# Patient Record
Sex: Female | Born: 1985 | Race: White | Hispanic: No | Marital: Married | Smoking: Current every day smoker
Health system: Southern US, Community
[De-identification: ages and names within clinical notes are randomized; demographics above are authoritative.]

## PROBLEM LIST (undated history)

## (undated) ENCOUNTER — Inpatient Hospital Stay: Payer: Self-pay

## (undated) DIAGNOSIS — F419 Anxiety disorder, unspecified: Secondary | ICD-10-CM

## (undated) DIAGNOSIS — S069X9A Unspecified intracranial injury with loss of consciousness of unspecified duration, initial encounter: Secondary | ICD-10-CM

## (undated) DIAGNOSIS — D369 Benign neoplasm, unspecified site: Secondary | ICD-10-CM

## (undated) HISTORY — DX: Unspecified intracranial injury with loss of consciousness of unspecified duration, initial encounter: S06.9X9A

## (undated) HISTORY — PX: OTHER SURGICAL HISTORY: SHX169

---
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
## 2007-02-18 DIAGNOSIS — S069XAA Unspecified intracranial injury with loss of consciousness status unknown, initial encounter: Secondary | ICD-10-CM

## 2007-02-18 DIAGNOSIS — S069X9A Unspecified intracranial injury with loss of consciousness of unspecified duration, initial encounter: Secondary | ICD-10-CM

## 2007-02-18 HISTORY — DX: Unspecified intracranial injury with loss of consciousness status unknown, initial encounter: S06.9XAA

## 2007-02-18 HISTORY — DX: Unspecified intracranial injury with loss of consciousness of unspecified duration, initial encounter: S06.9X9A

## 2007-09-20 ENCOUNTER — Emergency Department: Payer: Self-pay | Admitting: Emergency Medicine

## 2007-11-28 ENCOUNTER — Emergency Department: Payer: Self-pay | Admitting: Emergency Medicine

## 2011-10-14 ENCOUNTER — Emergency Department: Payer: Self-pay | Admitting: Emergency Medicine

## 2011-10-14 LAB — CBC
HCT: 32.8 % — ABNORMAL LOW (ref 35.0–47.0)
HGB: 10.9 g/dL — ABNORMAL LOW (ref 12.0–16.0)
MCH: 30.8 pg (ref 26.0–34.0)
MCHC: 33.2 g/dL (ref 32.0–36.0)
Platelet: 294 10*3/uL (ref 150–440)
RDW: 12.8 % (ref 11.5–14.5)
WBC: 11.4 10*3/uL — ABNORMAL HIGH (ref 3.6–11.0)

## 2011-10-14 LAB — URINALYSIS, COMPLETE
Ketone: NEGATIVE
Nitrite: NEGATIVE
Ph: 7 (ref 4.5–8.0)
Protein: NEGATIVE
RBC,UR: <1 /HPF (ref 0–5)
Specific Gravity: 1.026 (ref 1.003–1.030)

## 2011-10-14 LAB — COMPREHENSIVE METABOLIC PANEL
Albumin: 3.2 g/dL — ABNORMAL LOW (ref 3.4–5.0)
Alkaline Phosphatase: 60 U/L (ref 50–136)
Bilirubin,Total: 0.2 mg/dL (ref 0.2–1.0)
Co2: 25 mmol/L (ref 21–32)
Creatinine: 0.55 mg/dL — ABNORMAL LOW (ref 0.60–1.30)
EGFR (Non-African Amer.): >60
SGPT (ALT): 46 U/L (ref 12–78)

## 2012-01-11 ENCOUNTER — Ambulatory Visit: Payer: Self-pay | Admitting: Medical

## 2012-01-11 LAB — RAPID STREP-A WITH REFLX: Micro Text Report: NEGATIVE

## 2012-01-14 LAB — BETA STREP CULTURE(ARMC)

## 2012-02-19 ENCOUNTER — Inpatient Hospital Stay: Payer: Self-pay | Admitting: Obstetrics and Gynecology

## 2012-02-19 LAB — PIH PROFILE
Anion Gap: 10 (ref 7–16)
Calcium, Total: 8.7 mg/dL (ref 8.5–10.1)
Chloride: 107 mmol/L (ref 98–107)
EGFR (African American): >60
EGFR (Non-African Amer.): >60
Glucose: 85 mg/dL (ref 65–99)
HCT: 34.7 % — ABNORMAL LOW (ref 35.0–47.0)
MCH: 32.1 pg (ref 26.0–34.0)
MCHC: 34 g/dL (ref 32.0–36.0)
MCV: 94 fL (ref 80–100)
Platelet: 204 10*3/uL (ref 150–440)
Potassium: 4 mmol/L (ref 3.5–5.1)
SGOT(AST): 33 U/L (ref 15–37)
Sodium: 137 mmol/L (ref 136–145)
Uric Acid: 5.6 mg/dL (ref 2.6–6.0)
WBC: 9.1 10*3/uL (ref 3.6–11.0)

## 2012-02-19 LAB — PROTEIN / CREATININE RATIO, URINE: Creatinine, Urine: 49.6 mg/dL (ref 30.0–125.0)

## 2012-02-20 LAB — PIH PROFILE
Anion Gap: 9 (ref 7–16)
Calcium, Total: 8.6 mg/dL (ref 8.5–10.1)
Co2: 23 mmol/L (ref 21–32)
Creatinine: 0.87 mg/dL (ref 0.60–1.30)
EGFR (African American): >60
EGFR (Non-African Amer.): >60
HGB: 11.4 g/dL — ABNORMAL LOW (ref 12.0–16.0)
Osmolality: 279 (ref 275–301)
Platelet: 208 10*3/uL (ref 150–440)
SGOT(AST): 33 U/L (ref 15–37)
Sodium: 139 mmol/L (ref 136–145)
Uric Acid: 6.8 mg/dL — ABNORMAL HIGH (ref 2.6–6.0)

## 2012-02-21 LAB — HEMATOCRIT: HCT: 34.4 % — ABNORMAL LOW (ref 35.0–47.0)

## 2012-03-14 ENCOUNTER — Ambulatory Visit: Payer: Self-pay | Admitting: Internal Medicine

## 2012-11-14 ENCOUNTER — Ambulatory Visit: Payer: Self-pay | Admitting: Family Medicine

## 2012-11-14 LAB — RAPID STREP-A WITH REFLX: Micro Text Report: NEGATIVE

## 2014-06-27 NOTE — H&P (Signed)
L&D Evaluation:  History:   HPI 29yo G2P0010 at [redacted]w[redacted]d by 1st trimester U/S presents from the office for evaluation of elevated BPs, 140s/90s.  She denies HA, VC, RUQ pain.  She has mild UE and LE edema.  Good FM.  No LOF, VB or ctx.  PNC at [facility], uncomplicated.  PNL - A+, RI, VI, GBS neg.    Patient's Medical History anxiety    Patient's Surgical History leg surgery    Medications Pre Natal Vitamins    Allergies codeine    Social History none    Family History Non-Contributory   ROS:   ROS All systems were reviewed.  HEENT, CNS, GI, GU, Respiratory, CV, Renal and Musculoskeletal systems were found to be normal.   Exam:   Vital Signs 120-140/60-80    General no apparent distress    Mental Status clear    Chest normal effort    Abdomen gravid, non-tender    Estimated Fetal Weight Average for gestational age    Edema 1+    Pelvic closed in office today    Mebranes Intact    FHT normal rate with no decels, reactive NST    Ucx irregular    Skin dry    Lymph no lymphadenopathy    Other preE labs WNL, but PC ratio very elevated at 2500   Impression:   Impression G2P0010 at [redacted]w[redacted]d with likely mild preE   Plan:   Plan EFM/NST, monitor contractions and for cervical change, monitor BP    Comments - long discussion with patient re: likely diagnosis of preE given dramatically elevated PC ratio and elevated BPs, including risk to baby and self with continuation of pregnancy.  Discussed options of 24hr urine collection to pinpoint diagnosis vs. proceed with IOL.  Pt understands risks and benefits of each option and that IOL with unfavorable cervix increases her risk for C/S.  After careful contemplation has decided to proceed with IOL.  Will start with cervidil, followed by pitocin in am. - if BPs remain elevated will need to start magnesium in active labor.   Electronic Signatures: Thurmond Butts, Elinor Parkinson (MD)  (Signed 02-Jan-14 19:50)  Authored: L&D  Evaluation   Last Updated: 02-Jan-14 19:50 by Thurmond Butts, Elinor Parkinson (MD)

## 2015-11-01 LAB — HM PAP SMEAR

## 2016-01-07 ENCOUNTER — Other Ambulatory Visit: Payer: Self-pay | Admitting: Advanced Practice Midwife

## 2016-01-07 DIAGNOSIS — Z3482 Encounter for supervision of other normal pregnancy, second trimester: Secondary | ICD-10-CM

## 2016-01-13 ENCOUNTER — Emergency Department
Admission: EM | Admit: 2016-01-13 | Discharge: 2016-01-13 | Disposition: A | Discharge status: Home / Self Care | Payer: Self-pay | Attending: Emergency Medicine | Admitting: Emergency Medicine

## 2016-01-13 ENCOUNTER — Encounter: Payer: Self-pay | Admitting: Emergency Medicine

## 2016-01-13 DIAGNOSIS — O23592 Infection of other part of genital tract in pregnancy, second trimester: Secondary | ICD-10-CM | POA: Insufficient documentation

## 2016-01-13 DIAGNOSIS — O26892 Other specified pregnancy related conditions, second trimester: Secondary | ICD-10-CM

## 2016-01-13 DIAGNOSIS — R109 Unspecified abdominal pain: Secondary | ICD-10-CM

## 2016-01-13 DIAGNOSIS — N760 Acute vaginitis: Secondary | ICD-10-CM

## 2016-01-13 DIAGNOSIS — Z3A20 20 weeks gestation of pregnancy: Secondary | ICD-10-CM | POA: Insufficient documentation

## 2016-01-13 DIAGNOSIS — B9689 Other specified bacterial agents as the cause of diseases classified elsewhere: Secondary | ICD-10-CM

## 2016-01-13 HISTORY — DX: Benign neoplasm, unspecified site: D36.9

## 2016-01-13 LAB — COMPREHENSIVE METABOLIC PANEL
ALT: 15 U/L (ref 14–54)
ANION GAP: 8 (ref 5–15)
AST: 18 U/L (ref 15–41)
Albumin: 3.4 g/dL — ABNORMAL LOW (ref 3.5–5.0)
Alkaline Phosphatase: 39 U/L (ref 38–126)
BILIRUBIN TOTAL: 0.2 mg/dL — AB (ref 0.3–1.2)
BUN: 10 mg/dL (ref 6–20)
CO2: 22 mmol/L (ref 22–32)
Calcium: 8.4 mg/dL — ABNORMAL LOW (ref 8.9–10.3)
Chloride: 106 mmol/L (ref 101–111)
Creatinine, Ser: 0.58 mg/dL (ref 0.44–1.00)
Glucose, Bld: 106 mg/dL — ABNORMAL HIGH (ref 65–99)
POTASSIUM: 3.9 mmol/L (ref 3.5–5.1)
Sodium: 136 mmol/L (ref 135–145)
TOTAL PROTEIN: 7.2 g/dL (ref 6.5–8.1)

## 2016-01-13 LAB — WET PREP, GENITAL
SPERM: NONE SEEN
Trich, Wet Prep: NONE SEEN
YEAST WET PREP: NONE SEEN

## 2016-01-13 LAB — URINALYSIS COMPLETE WITH MICROSCOPIC (ARMC ONLY)
Bilirubin Urine: NEGATIVE
GLUCOSE, UA: NEGATIVE mg/dL
HGB URINE DIPSTICK: NEGATIVE
KETONES UR: NEGATIVE mg/dL
NITRITE: NEGATIVE
Protein, ur: NEGATIVE mg/dL
SPECIFIC GRAVITY, URINE: 1.017 (ref 1.005–1.030)
pH: 6 (ref 5.0–8.0)

## 2016-01-13 LAB — LIPASE, BLOOD: Lipase: 24 U/L (ref 11–51)

## 2016-01-13 LAB — CBC
HEMATOCRIT: 33.2 % — AB (ref 35.0–47.0)
HEMOGLOBIN: 11.5 g/dL — AB (ref 12.0–16.0)
MCH: 31.3 pg (ref 26.0–34.0)
MCHC: 34.7 g/dL (ref 32.0–36.0)
MCV: 90.1 fL (ref 80.0–100.0)
Platelets: 289 10*3/uL (ref 150–440)
RBC: 3.69 MIL/uL — ABNORMAL LOW (ref 3.80–5.20)
RDW: 13 % (ref 11.5–14.5)
WBC: 10.6 10*3/uL (ref 3.6–11.0)

## 2016-01-13 LAB — CHLAMYDIA/NGC RT PCR (ARMC ONLY)
CHLAMYDIA TR: NOT DETECTED
N GONORRHOEAE: NOT DETECTED

## 2016-01-13 MED ORDER — METRONIDAZOLE 500 MG PO TABS: 500.0000 mg | ORAL_TABLET | Freq: Two times a day (BID) | ORAL | 0 refills | Status: AC

## 2016-01-13 NOTE — ED Triage Notes (Signed)
abd pain - rlq. [redacted] weeks pregnant. No bleeding or spotting

## 2016-01-13 NOTE — ED Provider Notes (Signed)
[facility] Emergency Department Provider Note  Time seen: 2:22 PM  I have reviewed the triage vital signs and the nursing notes.   HISTORY  Chief Complaint Abdominal Pain    HPI Vickie Gomez is a 30 y.o. female G3 P1 A1, who presents the emergency department approximately [redacted] weeks pregnant with right-sided abdominal pain. According to the patient yesterday she was moving when she felt a sharp pain in the right side of her abdomen. States it felt like a pulled muscle. Patient states she did not think much of it however today the pain continues so she came to the emergency department for evaluation. She states no pain if she is sitting up, but experiences mild to moderate sharp pain in the right lower quadrant when lying flat, or twisting. Denies nausea, vomiting, diarrhea. Denies dysuria. Denies vaginal bleeding but does state increased vaginal discharge.  Past Medical History:  Diagnosis Date  . Dermoid cyst     There are no active problems to display for this patient.   No past surgical history on file.  Prior to Admission medications   Not on File    No Known Allergies  No family history on file.  Social History Social History  Substance Use Topics  . Smoking status: Never Smoker  . Smokeless tobacco: Never Used  . Alcohol use No    Review of Systems Constitutional: Negative for fever. Cardiovascular: Negative for chest pain. Respiratory: Negative for shortness of breath. Gastrointestinal: Right lower quadrant pain. Negative for nausea, vomiting, diarrhea. Genitourinary: Negative for dysuria. Positive for vaginal discharge. Musculoskeletal: Negative for back pain. Neurological: Negative for headache 10-point ROS otherwise negative.  ____________________________________________   PHYSICAL EXAM:  VITAL SIGNS: ED Triage Vitals  Enc Vitals Group     BP 01/13/16 1310 (!) 121/58     Pulse Rate 01/13/16 1310 85     Resp 01/13/16  1310 16     Temp 01/13/16 1310 98.2 F (36.8 C)     Temp src --      SpO2 01/13/16 1310 98 %     Weight 01/13/16 1311 134 lb (60.8 kg)     Height 01/13/16 1311 5\' 4"  (1.626 m)     Head Circumference --      Peak Flow --      Pain Score 01/13/16 1311 7     Pain Loc --      Pain Edu? --      Excl. in [place]? --     Constitutional: Alert and oriented. Well appearing and in no distress. Eyes: Normal exam ENT   Head: Normocephalic and atraumatic.   Mouth/Throat: Mucous membranes are moist. Cardiovascular: Normal rate, regular rhythm. No murmur Respiratory: Normal respiratory effort without tachypnea nor retractions. Breath sounds are clear Gastrointestinal: Soft, mild right lower quadrant tenderness palpation, no rebound or guarding, no distention. Mild suprapubic tenderness. Musculoskeletal: Nontender with normal range of motion in all extremities.  Neurologic:  Normal speech and language. No gross focal neurologic deficits  Skin:  Skin is warm, dry and intact.  Psychiatric: Mood and affect are normal.   ____________________________________________   INITIAL IMPRESSION / ASSESSMENT AND PLAN / ED COURSE  Pertinent labs & imaging results that were available during my care of the patient were reviewed by me and considered in my medical decision making (see chart for details).  The patient presents the emergency department with right lower quadrant pain approximately [redacted] weeks pregnant. Patient has mild tenderness on exam of right  lower quadrant and suprapubic region. No rebound or guarding. Denies any pain if she is sitting upright. Patient's labs are largely within normal limits including a normal white blood cell count. Very low suspicion for appendicitis at this time. Her symptoms appear to be more muscular in nature. Patient does describe increased vaginal discharge will perform a pelvic examination and send swabs. I'll perform a bedside ultrasound to further evaluate the  pregnancy.  Bedside ultrasound shows a normal intrauterine pregnancy. 158 bpm, good fetal movement, normal amount of amniotic fluid. Right-sided placenta.  Pelvic examination shows mild cervical erythema, mild to moderate discharge. No significant adnexal tenderness. No cervical motion tenderness. Wet prep, STD testing sent.  Given the patient's abdominal discomfort I discussed very strict return precautions for worsening pain or development of fever. However given her normal white blood cell count and minimal tenderness on examination along with the positional pain I highly suspect muscular pain, but discussed the possibility of appendicitis or ovarian cysts. Patient will follow up with her OB/GYN. She will return to the emergency department in 24 hours if the pain worsens or she spikes a fever for further evaluation and possible MRI.  Wet prep is positive for BV. We'll given Flagyl. Patient will follow up with her OB/GYN. I discussed strict return precautions. ____________________________________________   FINAL CLINICAL IMPRESSION(S) / ED DIAGNOSES  Abdominal pain during pregnancy Bacterial vaginitis   Harvest Dark, MD 01/13/16 1513

## 2016-01-13 NOTE — Discharge Instructions (Signed)
As we discussed please take antibiotic as prescribed for his entire duration. Please follow-up with your OB/GYN as soon as possible. Return to the emergency department if your abdominal pain worsens or you develop a fever, or any other symptom personally concerning to yourself.

## 2016-01-13 NOTE — ED Triage Notes (Signed)
Patient reports abd pain to RUQ, RLQ, and LLQ. States RLQ pain is severe and sharp in nature. States she sees [facility]. Due date is 06/07/15 (just shy of 20 weeks). Denies any bleeding.

## 2016-01-15 ENCOUNTER — Ambulatory Visit
Admission: RE | Admit: 2016-01-15 | Discharge: 2016-01-15 | Disposition: A | Discharge status: Home / Self Care | Payer: Self-pay | Source: Ambulatory Visit | Attending: Advanced Practice Midwife | Admitting: Advanced Practice Midwife

## 2016-01-15 DIAGNOSIS — Z3A19 19 weeks gestation of pregnancy: Secondary | ICD-10-CM | POA: Insufficient documentation

## 2016-01-15 DIAGNOSIS — Z3482 Encounter for supervision of other normal pregnancy, second trimester: Secondary | ICD-10-CM

## 2016-02-18 NOTE — L&D Delivery Note (Signed)
Delivery Note Primary OB: [place] Delivery Physician: Barnett Applebaum, MD Gestational Age: Full term Antepartum complications: none Intrapartum complications: None  A viable Female was delivered via vertex perentation.  Apgars:8 ,9  Weight:  pending .   Placenta status: spontaneous and Intact.  Cord: 3+ vessels;  with the following complications: none.  Anesthesia:  epidural Episiotomy:  none Lacerations:  none Suture Repair: none Est. Blood Loss (mL):  less than 100 mL  Mom to postpartum.  Baby to Couplet care / Skin to Skin.  Barnett Applebaum, MD, [facility], [facility] 06/12/2016  5:33 PM [phone]

## 2016-04-01 ENCOUNTER — Observation Stay
Admission: EM | Admit: 2016-04-01 | Discharge: 2016-04-02 | Disposition: A | Discharge status: Home / Self Care | Payer: Self-pay | Attending: Certified Nurse Midwife | Admitting: Certified Nurse Midwife

## 2016-04-01 DIAGNOSIS — O368130 Decreased fetal movements, third trimester, not applicable or unspecified: Principal | ICD-10-CM | POA: Insufficient documentation

## 2016-04-01 DIAGNOSIS — O99343 Other mental disorders complicating pregnancy, third trimester: Secondary | ICD-10-CM | POA: Insufficient documentation

## 2016-04-01 DIAGNOSIS — O368190 Decreased fetal movements, unspecified trimester, not applicable or unspecified: Secondary | ICD-10-CM

## 2016-04-01 DIAGNOSIS — F419 Anxiety disorder, unspecified: Secondary | ICD-10-CM | POA: Insufficient documentation

## 2016-04-01 DIAGNOSIS — Z3A30 30 weeks gestation of pregnancy: Secondary | ICD-10-CM | POA: Insufficient documentation

## 2016-04-01 HISTORY — DX: Anxiety disorder, unspecified: F41.9

## 2016-04-01 NOTE — Final Progress Note (Signed)
Physician Final Progress Note  Patient ID: Vickie Gomez MRN: [id number] DOB/AGE: 06-06-1985 31 y.o.  Admit date: 04/01/2016 Admitting provider: Malachy Mood, MD Discharge date: 04/01/2016   Admission Diagnoses: Decreased fetal movement at 30wk4d  Discharge Diagnoses:  Reactive Non stress test Consult: none  Significant Findings/ Diagnostic Studies: 31 year old G3 P1011 with EDC=06/07/2016 by a 7wk ultrasound presented at 30wk 4d with complaints of decreased fetal movement today. Has been eating and drinking well. Denies vaginal bleeding and contractions. Takes occasional clonazapam for panic attacks. Last dose 2 days ago. Prenatal care at [facility] and [facility].  Non stress test performed. NST reactive with baseline 140 and accelerations to 150s to 180s with moderate variability. Baby very active. Patient began feeling movement after monitors applied. Discharged home, reassured with fetal monitoring. Procedures: Non stress test  Discharge Condition: good  Disposition: 01-Home or Self Care  Diet: Regular diet  Discharge Activity: Activity as tolerated  Discharge Instructions    Discharge patient    Complete by:  As directed    Discharge disposition:  01-Home or Self Care   Discharge patient date:  04/01/2016     Allergies as of 04/01/2016   No Known Allergies     Medication List    TAKE these medications   clonazePAM 0.5 MG tablet Commonly known as:  KLONOPIN Take 0.5 mg by mouth 2 (two) times daily as needed for anxiety.   prenatal multivitamin Tabs tablet Take 1 tablet by mouth daily at 12 noon.      Follow-up Information    [facility] Follow up on 04/04/2016.   Why:  please keep scheduled OB appointment Contact information: [address] [place] [id number] [phone]        Follow up appt at [facility] 04/07/2016 0850  Total time spent taking care of this patient: 10-15 minutes.  SignedDalia Heading 04/01/2016, 10:18 PM

## 2016-04-01 NOTE — Discharge Summary (Signed)
Patient discharged with instructions on follow up appointment, labor precautions, fetal kick count, and when to seek medical attention. Patient ambulatory at discharge with steady gait and no complaints. Patient verbalized understanding of discharge instructions.

## 2016-04-01 NOTE — OB Triage Note (Signed)
Patient came in for observation for decreased fetal movement. Patient states she has only felt little flutters today and not the normal fetal movement. Patient states she has been having frequent nightmares atr night where she wake up in a panic attack. Patient denies uterine contractions. Patient denies leaking of fluid, vaginal bleeding and spotting. Vital signs stable and patient afebrile. FHR baseline 1305 with moderate variability with accelerations 15 x 15 and no decelerations. Will continue to monitor.

## 2016-04-25 DIAGNOSIS — O09299 Supervision of pregnancy with other poor reproductive or obstetric history, unspecified trimester: Secondary | ICD-10-CM

## 2016-04-25 LAB — HM PAP SMEAR
Chlamydia DNA by PCR: NEGATIVE
HPV Aptima: NEGATIVE
Neisseria gonorrhoeae, NAA: NEGATIVE

## 2016-04-28 ENCOUNTER — Ambulatory Visit (INDEPENDENT_AMBULATORY_CARE_PROVIDER_SITE_OTHER): Payer: Self-pay | Admitting: Obstetrics & Gynecology

## 2016-04-28 VITALS — BP 100/60 | HR 96 | Wt 170.0 lb

## 2016-04-28 DIAGNOSIS — Z3490 Encounter for supervision of normal pregnancy, unspecified, unspecified trimester: Secondary | ICD-10-CM

## 2016-04-28 DIAGNOSIS — Z8759 Personal history of other complications of pregnancy, childbirth and the puerperium: Secondary | ICD-10-CM | POA: Insufficient documentation

## 2016-04-28 DIAGNOSIS — Z3A34 34 weeks gestation of pregnancy: Secondary | ICD-10-CM

## 2016-04-28 NOTE — Progress Notes (Signed)
PNV, [place], PTL precauitons, Preeclampsia precauitons,trying to get labs from [facility] TDaP UTD PAP ASCUS, PP PAP

## 2016-05-03 ENCOUNTER — Observation Stay
Admission: EM | Admit: 2016-05-03 | Discharge: 2016-05-03 | Disposition: A | Discharge status: Home / Self Care | Payer: Self-pay | Attending: Obstetrics and Gynecology | Admitting: Obstetrics and Gynecology

## 2016-05-03 DIAGNOSIS — Z87820 Personal history of traumatic brain injury: Secondary | ICD-10-CM | POA: Insufficient documentation

## 2016-05-03 DIAGNOSIS — Z9889 Other specified postprocedural states: Secondary | ICD-10-CM | POA: Insufficient documentation

## 2016-05-03 DIAGNOSIS — Z8659 Personal history of other mental and behavioral disorders: Secondary | ICD-10-CM | POA: Insufficient documentation

## 2016-05-03 DIAGNOSIS — Z3A35 35 weeks gestation of pregnancy: Secondary | ICD-10-CM

## 2016-05-03 DIAGNOSIS — Z888 Allergy status to other drugs, medicaments and biological substances status: Secondary | ICD-10-CM | POA: Insufficient documentation

## 2016-05-03 DIAGNOSIS — Z885 Allergy status to narcotic agent status: Secondary | ICD-10-CM | POA: Insufficient documentation

## 2016-05-03 DIAGNOSIS — O368131 Decreased fetal movements, third trimester, fetus 1: Secondary | ICD-10-CM | POA: Diagnosis present

## 2016-05-03 DIAGNOSIS — O368130 Decreased fetal movements, third trimester, not applicable or unspecified: Principal | ICD-10-CM | POA: Insufficient documentation

## 2016-05-03 DIAGNOSIS — Z3490 Encounter for supervision of normal pregnancy, unspecified, unspecified trimester: Secondary | ICD-10-CM

## 2016-05-03 DIAGNOSIS — O09299 Supervision of pregnancy with other poor reproductive or obstetric history, unspecified trimester: Secondary | ICD-10-CM
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
## 2016-05-03 NOTE — Discharge Summary (Signed)
See final progress note. 

## 2016-05-03 NOTE — OB Triage Note (Addendum)
Pt reports decreased FM x 2 days and LOF x2 days.  Pt reports HA around eyes that has been unrelieved since last Sunday and floaters in her vision.  Pt denies epigastric pain, but reports swelling "off and on" throughout the week in her hands and lower extremities.  No swelling present currently.

## 2016-05-03 NOTE — Progress Notes (Signed)
Nitrazine negative 

## 2016-05-13 ENCOUNTER — Encounter: Payer: Self-pay | Admitting: *Deleted

## 2016-05-13 ENCOUNTER — Emergency Department
Admission: EM | Admit: 2016-05-13 | Discharge: 2016-05-13 | Disposition: A | Discharge status: Home / Self Care | Payer: Self-pay | Attending: Emergency Medicine | Admitting: Emergency Medicine

## 2016-05-13 DIAGNOSIS — O99613 Diseases of the digestive system complicating pregnancy, third trimester: Secondary | ICD-10-CM | POA: Insufficient documentation

## 2016-05-13 DIAGNOSIS — R112 Nausea with vomiting, unspecified: Secondary | ICD-10-CM

## 2016-05-13 DIAGNOSIS — Z3A36 36 weeks gestation of pregnancy: Secondary | ICD-10-CM | POA: Insufficient documentation

## 2016-05-13 DIAGNOSIS — K644 Residual hemorrhoidal skin tags: Secondary | ICD-10-CM

## 2016-05-13 DIAGNOSIS — O2243 Hemorrhoids in pregnancy, third trimester: Secondary | ICD-10-CM | POA: Insufficient documentation

## 2016-05-13 DIAGNOSIS — Z3493 Encounter for supervision of normal pregnancy, unspecified, third trimester: Secondary | ICD-10-CM

## 2016-05-13 DIAGNOSIS — K2900 Acute gastritis without bleeding: Secondary | ICD-10-CM | POA: Insufficient documentation

## 2016-05-13 LAB — CBC
HCT: 32.4 % — ABNORMAL LOW (ref 35.0–47.0)
Hemoglobin: 11 g/dL — ABNORMAL LOW (ref 12.0–16.0)
MCH: 30 pg (ref 26.0–34.0)
MCHC: 34 g/dL (ref 32.0–36.0)
MCV: 88.2 fL (ref 80.0–100.0)
PLATELETS: 303 10*3/uL (ref 150–440)
RBC: 3.67 MIL/uL — AB (ref 3.80–5.20)
RDW: 12.8 % (ref 11.5–14.5)
WBC: 12.9 10*3/uL — AB (ref 3.6–11.0)

## 2016-05-13 LAB — BASIC METABOLIC PANEL
Anion gap: 8 (ref 5–15)
BUN: 10 mg/dL (ref 6–20)
CALCIUM: 9 mg/dL (ref 8.9–10.3)
CHLORIDE: 106 mmol/L (ref 101–111)
CO2: 21 mmol/L — ABNORMAL LOW (ref 22–32)
CREATININE: 0.55 mg/dL (ref 0.44–1.00)
Glucose, Bld: 94 mg/dL (ref 65–99)
Potassium: 3.7 mmol/L (ref 3.5–5.1)
SODIUM: 135 mmol/L (ref 135–145)

## 2016-05-13 LAB — TROPONIN I

## 2016-05-13 MED ORDER — TUCKS 50 % EX PADS: 1.0000 "application " | MEDICATED_PAD | CUTANEOUS | 2 refills | Status: DC | PRN

## 2016-05-13 MED ORDER — FAMOTIDINE 20 MG PO TABS: 20.0000 mg | ORAL_TABLET | Freq: Two times a day (BID) | ORAL | 0 refills | Status: DC

## 2016-05-13 NOTE — ED Notes (Signed)
Signature pad not working.  Patient verbalized understanding of discharge instructions and has no further questions. 
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
## 2016-05-13 NOTE — ED Notes (Signed)
Spoke with nurse on labor and delivery, pt needs to be cleared medically in the ER prior to coming to labor and delivery if needed. Pt is 36 weeks with her third child, pt denies any pregnancy related issues at this time. NAD. Reports chest pain and vomiting blood.

## 2016-05-13 NOTE — ED Notes (Addendum)
Pt report reflux is worse when lying flat. Pt also reports she has felt the baby move today,

## 2016-05-13 NOTE — ED Triage Notes (Signed)
States she woke up this AM and felt chest pressure, states she vomited and noticed and bright red and dark red blood in her vomit, states some SOB when she lies down, states coughing as well, pt is [redacted] weeks pregnant, denies any vaginal bleeding or leaking fluid, awake and alert

## 2016-05-15 ENCOUNTER — Other Ambulatory Visit: Payer: Self-pay | Admitting: Obstetrics & Gynecology

## 2016-05-15 ENCOUNTER — Ambulatory Visit (INDEPENDENT_AMBULATORY_CARE_PROVIDER_SITE_OTHER): Payer: Self-pay | Admitting: Advanced Practice Midwife

## 2016-05-15 VITALS — BP 124/62 | Wt 174.0 lb

## 2016-05-15 DIAGNOSIS — Z3685 Encounter for antenatal screening for Streptococcus B: Secondary | ICD-10-CM

## 2016-05-15 DIAGNOSIS — Z113 Encounter for screening for infections with a predominantly sexual mode of transmission: Secondary | ICD-10-CM

## 2016-05-15 DIAGNOSIS — Z3A36 36 weeks gestation of pregnancy: Secondary | ICD-10-CM

## 2016-05-15 NOTE — Progress Notes (Signed)
c/o severe heartburn and was prescribed pepcid. Doing well today. Reviewed labor precautions.

## 2016-05-15 NOTE — Progress Notes (Signed)
GBS/Aptima today 

## 2016-05-18 LAB — GC/CHLAMYDIA PROBE AMP
Chlamydia trachomatis, NAA: NEGATIVE
Neisseria gonorrhoeae by PCR: NEGATIVE

## 2016-05-18 LAB — STREP GP B NAA: Strep Gp B NAA: POSITIVE — AB

## 2016-05-20 ENCOUNTER — Encounter: Payer: MEDICAID | Admitting: Obstetrics and Gynecology

## 2016-05-20 ENCOUNTER — Ambulatory Visit (INDEPENDENT_AMBULATORY_CARE_PROVIDER_SITE_OTHER): Payer: Self-pay | Admitting: Obstetrics and Gynecology

## 2016-05-20 VITALS — BP 122/60 | Wt 177.0 lb

## 2016-05-20 DIAGNOSIS — O09299 Supervision of pregnancy with other poor reproductive or obstetric history, unspecified trimester: Secondary | ICD-10-CM

## 2016-05-20 DIAGNOSIS — Z3A37 37 weeks gestation of pregnancy: Secondary | ICD-10-CM

## 2016-05-20 DIAGNOSIS — Z3490 Encounter for supervision of normal pregnancy, unspecified, unspecified trimester: Secondary | ICD-10-CM

## 2016-05-20 NOTE — Patient Instructions (Signed)

## 2016-05-23 ENCOUNTER — Encounter: Payer: Self-pay | Admitting: Advanced Practice Midwife

## 2016-05-27 ENCOUNTER — Telehealth: Payer: Self-pay

## 2016-05-27 ENCOUNTER — Encounter: Payer: Self-pay | Admitting: Obstetrics & Gynecology

## 2016-05-27 ENCOUNTER — Observation Stay
Admission: EM | Admit: 2016-05-27 | Discharge: 2016-05-27 | Disposition: A | Discharge status: Home / Self Care | Payer: Self-pay | Attending: Certified Nurse Midwife | Admitting: Certified Nurse Midwife

## 2016-05-27 ENCOUNTER — Encounter: Payer: Self-pay | Admitting: Obstetrics and Gynecology

## 2016-05-27 DIAGNOSIS — Z3A38 38 weeks gestation of pregnancy: Secondary | ICD-10-CM | POA: Insufficient documentation

## 2016-05-27 DIAGNOSIS — O368130 Decreased fetal movements, third trimester, not applicable or unspecified: Secondary | ICD-10-CM

## 2016-05-27 DIAGNOSIS — O368190 Decreased fetal movements, unspecified trimester, not applicable or unspecified: Secondary | ICD-10-CM | POA: Diagnosis present

## 2016-05-27 DIAGNOSIS — O09299 Supervision of pregnancy with other poor reproductive or obstetric history, unspecified trimester: Secondary | ICD-10-CM

## 2016-05-27 NOTE — Final Progress Note (Signed)
Physician Final Progress Note  Patient ID: Vickie Gomez MRN: [id number] DOB/AGE: [birth year] 31 y.o.  Admit date: 05/27/2016 Admitting provider: Malachy Mood, MD Discharge date: 05/27/2016   Admission Diagnoses: Decreased fetal movement and cramping  Discharge Diagnoses: IUP at 38wk3d with reactive NST Not in labor Consults: None  Significant Findings/ Diagnostic Studies: 31 year old G3 P1011 with EDC=06/07/2016 by a 7wk6d ultrasound presents with c/o decreased FM. Baby just not moving as much since yesterday unless patient pushes on her belly. Has been having some cramping and some contractions. Contractions not regular. No bleeding or leaking of fluid. Prenatal care begun at [facility], then transferred to [facility], then transferred back to [facility] in third trimester.  Prenatal care fairly uncomplicated. Hx of preeclampsia with first pregnancy at term.  Exam: BP 129/77   Pulse 75   Temp 98.2 F (36.8 C) (Oral)   Resp 16   Ht 5\' 3"  (1.6 m)   Wt 80.3 kg (177 lb)   LMP 08/22/2015   BMI 31.35 kg/m   General: WF, gravid, no acute distress FHR: 125-130 with accelerations to 150s to 160s, with moderate variability Toco: occasional mild contraction Abd: soft, uterus non tender, cephalic presentation Cervix: closed/60%/-2  A: Reactive NST at 38wk3d P: Discharge home  Procedures: NST  Discharge Condition: stable  Disposition: 01-Home or Self Care  Diet: Regular diet  Discharge Activity: Activity as tolerated  Discharge Instructions    Discharge patient    Complete by:  As directed    Discharge disposition:  01-Home or Self Care   Discharge patient date:  05/27/2016     Allergies as of 05/27/2016      Reactions   Codeine Palpitations   Diphenhydramine Palpitations      Medication List    STOP taking these medications   famotidine 20 MG tablet Commonly known as:  PEPCID   terconazole 0.8 % vaginal cream Commonly known as:  TERAZOL 3     TAKE these  medications   prenatal multivitamin Tabs tablet Take 1 tablet by mouth daily at 12 noon.   TUCKS 50 % Pads Apply 1 application topically every 2 (two) hours as needed.        Total time spent taking care of this patient: 15 minutes  Signed: Dalia Heading 05/27/2016, 5:07 PM

## 2016-05-27 NOTE — Telephone Encounter (Signed)
msg received 05/26/16 4:30 pm. Pt is 38 wks and c/o pain and pinching sensation and feels sore and cramping. Pt also stated she had not felt baby move in the past 6 hours. Left msg for pt to call back.

## 2016-05-27 NOTE — OB Triage Note (Signed)
Pt G3P1 [redacted]w[redacted]d complains of decreased fetal movement. Pt states a little cramping. Denies leaking/gush of fluid. VSS. Rates pain a 3/10 in lower abdomen due to cramping.

## 2016-05-27 NOTE — Telephone Encounter (Signed)
Call transferred to me by Cristal Deer. Spoke w/pt. She states she has noticed a significant decrease in fetal movement yesterday and today. Baby only moves if she pokes her belly and basically makes her move. She attempted fetal kick count  Last night and only noticed baby roll 1 time. Apt scheduled w/SDJ today at 2:00 for eval.

## 2016-05-29 ENCOUNTER — Ambulatory Visit (INDEPENDENT_AMBULATORY_CARE_PROVIDER_SITE_OTHER): Payer: Self-pay | Admitting: Advanced Practice Midwife

## 2016-05-29 VITALS — BP 118/76 | Wt 176.0 lb

## 2016-05-29 DIAGNOSIS — Z3A38 38 weeks gestation of pregnancy: Secondary | ICD-10-CM

## 2016-05-29 NOTE — Progress Notes (Signed)
Doing well. Heartburn has improved. Reviewed labor precautions.

## 2016-06-05 ENCOUNTER — Ambulatory Visit (INDEPENDENT_AMBULATORY_CARE_PROVIDER_SITE_OTHER): Payer: Self-pay | Admitting: Obstetrics & Gynecology

## 2016-06-05 VITALS — BP 110/70 | Wt 178.0 lb

## 2016-06-05 DIAGNOSIS — Z3490 Encounter for supervision of normal pregnancy, unspecified, unspecified trimester: Secondary | ICD-10-CM

## 2016-06-05 DIAGNOSIS — Z3A39 39 weeks gestation of pregnancy: Secondary | ICD-10-CM

## 2016-06-05 DIAGNOSIS — O09299 Supervision of pregnancy with other poor reproductive or obstetric history, unspecified trimester: Secondary | ICD-10-CM

## 2016-06-05 NOTE — Progress Notes (Signed)
PNV, [place], Labor precautions, Preclampsia precautions (prior hx), GBS pos discussed, IOL discussed if needed for post dates next week (prefer to wait if possible due to closed cervix today). Breast feeding.  Vasec planned.

## 2016-06-06 ENCOUNTER — Telehealth: Payer: Self-pay

## 2016-06-06 ENCOUNTER — Encounter: Payer: Self-pay | Admitting: Obstetrics and Gynecology

## 2016-06-06 NOTE — Telephone Encounter (Signed)
Pt calling. 40wks had cx exam yesterday.  Today has had a mucous brown d/c, lbp, is GBS+.  Labor prec reviewed.

## 2016-06-09 ENCOUNTER — Telehealth: Payer: Self-pay

## 2016-06-09 NOTE — Telephone Encounter (Signed)
Pt is 40 wks 2 days with c/o ctx every 5-7 mins since 3 am. Feels like stomach tightening and has lower back pain and cramping. Labor precautions given and pt to go to L&D if having true ctx or water breaks.

## 2016-06-10 ENCOUNTER — Ambulatory Visit (INDEPENDENT_AMBULATORY_CARE_PROVIDER_SITE_OTHER): Payer: Self-pay | Admitting: Obstetrics and Gynecology

## 2016-06-10 VITALS — BP 126/76 | Wt 180.0 lb

## 2016-06-10 DIAGNOSIS — Z3483 Encounter for supervision of other normal pregnancy, third trimester: Secondary | ICD-10-CM

## 2016-06-10 DIAGNOSIS — Z3A40 40 weeks gestation of pregnancy: Secondary | ICD-10-CM
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
## 2016-06-10 NOTE — Progress Notes (Signed)
Contractions yesterday 5-7 minutes apart but not consistant

## 2016-06-11 ENCOUNTER — Inpatient Hospital Stay
Admit: 2016-06-11 | Discharge: 2016-06-13 | DRG: 775 | Disposition: A | Discharge status: Home / Self Care | Payer: Self-pay | Attending: Obstetrics & Gynecology | Admitting: Obstetrics & Gynecology

## 2016-06-11 ENCOUNTER — Other Ambulatory Visit: Payer: Self-pay | Admitting: Obstetrics & Gynecology

## 2016-06-11 DIAGNOSIS — K219 Gastro-esophageal reflux disease without esophagitis: Secondary | ICD-10-CM | POA: Diagnosis present

## 2016-06-11 DIAGNOSIS — O9962 Diseases of the digestive system complicating childbirth: Secondary | ICD-10-CM | POA: Diagnosis present

## 2016-06-11 DIAGNOSIS — O99824 Streptococcus B carrier state complicating childbirth: Secondary | ICD-10-CM | POA: Diagnosis present

## 2016-06-11 DIAGNOSIS — Z3490 Encounter for supervision of normal pregnancy, unspecified, unspecified trimester: Secondary | ICD-10-CM

## 2016-06-11 DIAGNOSIS — O480 Post-term pregnancy: Principal | ICD-10-CM | POA: Diagnosis present

## 2016-06-11 DIAGNOSIS — Z3A40 40 weeks gestation of pregnancy: Secondary | ICD-10-CM

## 2016-06-11 LAB — CBC
HEMATOCRIT: 34.1 % — AB (ref 35.0–47.0)
HEMOGLOBIN: 11.5 g/dL — AB (ref 12.0–16.0)
MCH: 29.5 pg (ref 26.0–34.0)
MCHC: 33.6 g/dL (ref 32.0–36.0)
MCV: 87.6 fL (ref 80.0–100.0)
Platelets: 301 10*3/uL (ref 150–440)
RBC: 3.9 MIL/uL (ref 3.80–5.20)
RDW: 13.2 % (ref 11.5–14.5)
WBC: 12.6 10*3/uL — ABNORMAL HIGH (ref 3.6–11.0)

## 2016-06-11 LAB — TYPE AND SCREEN
ABO/RH(D): A POS
ANTIBODY SCREEN: NEGATIVE

## 2016-06-11 MED ORDER — TERBUTALINE SULFATE 1 MG/ML IJ SOLN: 0.2500 mg | Freq: Once | INTRAMUSCULAR | Status: DC | PRN

## 2016-06-11 MED ORDER — LACTATED RINGERS IV SOLN: 500.0000 mL | INTRAVENOUS | Status: DC | PRN

## 2016-06-11 MED ORDER — ONDANSETRON HCL 4 MG/2ML IJ SOLN: 4.0000 mg | Freq: Four times a day (QID) | INTRAMUSCULAR | Status: DC | PRN

## 2016-06-11 MED ORDER — DINOPROSTONE 10 MG VA INST
10.0000 mg | VAGINAL_INSERT | Freq: Once | VAGINAL | Status: AC
  Administered 2016-06-11: 10 mg via VAGINAL
  Filled 2016-06-11: qty 1

## 2016-06-11 MED ORDER — LACTATED RINGERS IV SOLN
INTRAVENOUS | Status: DC
  Administered 2016-06-11: 20:00:00 via INTRAVENOUS
  Administered 2016-06-12: 1000 mL via INTRAVENOUS
  Administered 2016-06-12: 08:00:00 via INTRAVENOUS

## 2016-06-11 MED ORDER — OXYTOCIN BOLUS FROM INFUSION: 500.0000 mL | Freq: Once | INTRAVENOUS | Status: DC

## 2016-06-11 MED ORDER — ACETAMINOPHEN 325 MG PO TABS: 650.0000 mg | ORAL_TABLET | ORAL | Status: DC | PRN

## 2016-06-11 MED ORDER — BUTORPHANOL TARTRATE 1 MG/ML IJ SOLN
1.0000 mg | INTRAMUSCULAR | Status: DC | PRN
  Administered 2016-06-12 (×2): 1 mg via INTRAVENOUS
  Filled 2016-06-11 (×2): qty 1

## 2016-06-11 MED ORDER — OXYTOCIN 40 UNITS IN LACTATED RINGERS INFUSION - SIMPLE MED: 2.5000 [IU]/h | INTRAVENOUS | Status: DC

## 2016-06-11 NOTE — H&P (Signed)
History and Physical Interval Note:  06/11/2016 8:31 PM  Vickie Gomez  has presented today for INDUCTION OF LABOR (cervical ripening agents),  with the diagnosis of Postdates. The various methods of treatment have been discussed with the patient and family. After consideration of risks, benefits and other options for treatment, the patient has consented to  Labor induction .  The patient's history has been reviewed, patient examined, no change in status, and is stable for induction as planned.  See H&P. I have reviewed the patient's chart and labs.  Questions were answered to the patient's satisfaction.    Barnett Applebaum, MD, [facility], [facility] 06/11/2016  8:31 PM

## 2016-06-12 ENCOUNTER — Inpatient Hospital Stay: Payer: Self-pay | Admitting: Certified Registered"

## 2016-06-12 DIAGNOSIS — Z3A40 40 weeks gestation of pregnancy: Secondary | ICD-10-CM

## 2016-06-12 LAB — RAPID HIV SCREEN (HIV 1/2 AB+AG)
HIV 1/2 ANTIBODIES: NONREACTIVE
HIV-1 P24 Antigen - HIV24: NONREACTIVE

## 2016-06-12 MED ORDER — SIMETHICONE 80 MG PO CHEW: 80.0000 mg | CHEWABLE_TABLET | ORAL | Status: DC | PRN

## 2016-06-12 MED ORDER — SODIUM CHLORIDE 0.9% FLUSH: 3.0000 mL | INTRAVENOUS | Status: DC | PRN

## 2016-06-12 MED ORDER — BUPIVACAINE HCL (PF) 0.25 % IJ SOLN
INTRAMUSCULAR | Status: DC | PRN
  Administered 2016-06-12: 5 mL via EPIDURAL
  Administered 2016-06-12: 3 mL via EPIDURAL

## 2016-06-12 MED ORDER — OXYCODONE-ACETAMINOPHEN 5-325 MG PO TABS: 2.0000 | ORAL_TABLET | ORAL | Status: DC | PRN

## 2016-06-12 MED ORDER — SODIUM CHLORIDE FLUSH 0.9 % IV SOLN
INTRAVENOUS | Status: AC
  Administered 2016-06-12: 08:00:00
  Filled 2016-06-12: qty 10

## 2016-06-12 MED ORDER — ONDANSETRON HCL 4 MG PO TABS: 4.0000 mg | ORAL_TABLET | ORAL | Status: DC | PRN

## 2016-06-12 MED ORDER — AMMONIA AROMATIC IN INHA
RESPIRATORY_TRACT | Status: AC
  Filled 2016-06-12: qty 10

## 2016-06-12 MED ORDER — LIDOCAINE HCL (PF) 1 % IJ SOLN
INTRAMUSCULAR | Status: AC
  Filled 2016-06-12: qty 30

## 2016-06-12 MED ORDER — AMPICILLIN SODIUM 2 G IJ SOLR
2.0000 g | Freq: Once | INTRAMUSCULAR | Status: AC
  Administered 2016-06-12: 2 g via INTRAVENOUS
  Filled 2016-06-12: qty 2000

## 2016-06-12 MED ORDER — LIDOCAINE-EPINEPHRINE (PF) 1.5 %-1:200000 IJ SOLN
INTRAMUSCULAR | Status: DC | PRN
  Administered 2016-06-12: 3 mL via EPIDURAL

## 2016-06-12 MED ORDER — OXYCODONE-ACETAMINOPHEN 5-325 MG PO TABS: 1.0000 | ORAL_TABLET | ORAL | Status: DC | PRN

## 2016-06-12 MED ORDER — FENTANYL 2.5 MCG/ML W/ROPIVACAINE 0.2% IN NS 100 ML EPIDURAL INFUSION (ARMC-ANES)
EPIDURAL | Status: AC
  Filled 2016-06-12: qty 100

## 2016-06-12 MED ORDER — BENZOCAINE-MENTHOL 20-0.5 % EX AERO: 1.0000 "application " | INHALATION_SPRAY | CUTANEOUS | Status: DC | PRN

## 2016-06-12 MED ORDER — IBUPROFEN 600 MG PO TABS
600.0000 mg | ORAL_TABLET | Freq: Four times a day (QID) | ORAL | Status: DC
  Administered 2016-06-12 – 2016-06-13 (×5): 600 mg via ORAL
  Filled 2016-06-12 (×5): qty 1

## 2016-06-12 MED ORDER — OXYTOCIN 10 UNIT/ML IJ SOLN
INTRAMUSCULAR | Status: AC
  Filled 2016-06-12: qty 2

## 2016-06-12 MED ORDER — WITCH HAZEL-GLYCERIN EX PADS: 1.0000 "application " | MEDICATED_PAD | CUTANEOUS | Status: DC | PRN

## 2016-06-12 MED ORDER — MISOPROSTOL 200 MCG PO TABS
ORAL_TABLET | ORAL | Status: AC
  Filled 2016-06-12: qty 4

## 2016-06-12 MED ORDER — SODIUM CHLORIDE 0.9% FLUSH: 3.0000 mL | Freq: Two times a day (BID) | INTRAVENOUS | Status: DC

## 2016-06-12 MED ORDER — ONDANSETRON HCL 4 MG/2ML IJ SOLN: 4.0000 mg | INTRAMUSCULAR | Status: DC | PRN

## 2016-06-12 MED ORDER — ZOLPIDEM TARTRATE 5 MG PO TABS: 5.0000 mg | ORAL_TABLET | Freq: Every evening | ORAL | Status: DC | PRN

## 2016-06-12 MED ORDER — OXYTOCIN 40 UNITS IN LACTATED RINGERS INFUSION - SIMPLE MED
1.0000 m[IU]/min | INTRAVENOUS | Status: DC
  Administered 2016-06-12: 2 m[IU]/min via INTRAVENOUS

## 2016-06-12 MED ORDER — DIBUCAINE 1 % RE OINT: 1.0000 "application " | TOPICAL_OINTMENT | RECTAL | Status: DC | PRN

## 2016-06-12 MED ORDER — DIPHENHYDRAMINE HCL 25 MG PO CAPS: 25.0000 mg | ORAL_CAPSULE | Freq: Four times a day (QID) | ORAL | Status: DC | PRN

## 2016-06-12 MED ORDER — SODIUM CHLORIDE 0.9 % IV SOLN
1.0000 g | INTRAVENOUS | Status: DC
  Administered 2016-06-12 (×2): 1 g via INTRAVENOUS
  Filled 2016-06-12 (×2): qty 1000

## 2016-06-12 MED ORDER — FENTANYL 2.5 MCG/ML W/ROPIVACAINE 0.2% IN NS 100 ML EPIDURAL INFUSION (ARMC-ANES)
EPIDURAL | Status: DC | PRN
  Administered 2016-06-12: 9 mL/h via EPIDURAL

## 2016-06-12 MED ORDER — SODIUM CHLORIDE 0.9 % IV SOLN: 250.0000 mL | INTRAVENOUS | Status: DC | PRN

## 2016-06-12 MED ORDER — LIDOCAINE HCL (PF) 1 % IJ SOLN
INTRAMUSCULAR | Status: DC | PRN
  Administered 2016-06-12: 3 mL via SUBCUTANEOUS

## 2016-06-12 MED ORDER — SENNOSIDES-DOCUSATE SODIUM 8.6-50 MG PO TABS
2.0000 | ORAL_TABLET | ORAL | Status: DC
  Administered 2016-06-12: 2 via ORAL
  Filled 2016-06-12: qty 2

## 2016-06-12 MED ORDER — COCONUT OIL OIL
1.0000 "application " | TOPICAL_OIL | Status: DC | PRN
  Filled 2016-06-12: qty 120

## 2016-06-12 MED ORDER — ACETAMINOPHEN 325 MG PO TABS: 650.0000 mg | ORAL_TABLET | ORAL | Status: DC | PRN

## 2016-06-12 MED ORDER — TERBUTALINE SULFATE 1 MG/ML IJ SOLN: 0.2500 mg | Freq: Once | INTRAMUSCULAR | Status: DC | PRN

## 2016-06-12 NOTE — Anesthesia Procedure Notes (Signed)
Epidural Patient location during procedure: OB  Staffing Anesthesiologist: Gunnar Fusi Resident/CRNA: Rolla Plate Performed: resident/CRNA   Preanesthetic Checklist Completed: patient identified, site marked, surgical consent, pre-op evaluation, timeout performed, IV checked, risks and benefits discussed and monitors and equipment checked  Epidural Patient position: sitting Prep: ChloraPrep and site prepped and draped Patient monitoring: heart rate, continuous pulse ox and blood pressure Approach: midline Location: L4-L5 Injection technique: LOR saline  Needle:  Needle type: Tuohy  Needle gauge: 17 G Needle length: 9 cm and 9 Needle insertion depth: 6 cm Catheter type: closed end flexible Catheter size: 19 Gauge Catheter at skin depth: 11 cm Test dose: negative and 1.5% lidocaine with Epi 1:200 K  Assessment Events: blood not aspirated, injection not painful, no injection resistance, negative IV test and no paresthesia  Additional Notes   Patient tolerated the insertion well without complications.Reason for block:procedure for pain

## 2016-06-12 NOTE — Progress Notes (Signed)
  Labor Progress Note   31 y.o. [id number] @ [redacted]w[redacted]d , admitted for  Pregnancy, Labor Management. Post Dates.  Subjective:  Mild ctxs, irreg. No ROM or VB.  Objective:  BP 126/67 (BP Location: Left Arm)   Pulse 79   Temp 98.1 F (36.7 C) (Oral)   Resp 18   LMP 08/22/2015  Abd: mild Extr: trace to 1+ bilateral pedal edema SVE: 1/60/-2  EFM: FHR: 150 bpm, variability: moderate,  accelerations:  Present,  decelerations:  Absent Toco: irreg Labs: I have reviewed the patient's lab results.   Assessment & Plan:  G3P1011 @ [redacted]w[redacted]d, admitted for  Pregnancy and Labor/Delivery Management  1. Pain management: none. 2. FWB: FHT category 1.  3. ID: GBS positive 4. Labor management: ABX for GBS. Start Pitocin. Analgesia or Epidural as desired.  All discussed with patient, see orders  Barnett Applebaum, MD, [facility], [facility] 06/12/2016  10:22 AM

## 2016-06-12 NOTE — Anesthesia Preprocedure Evaluation (Signed)
Anesthesia Evaluation  Patient identified by MRN, date of birth, ID band Patient awake    Reviewed: Allergy & Precautions, H&P , NPO status , Patient's Chart, lab work & pertinent test results  Airway Mallampati: II  TM Distance: >3 FB Neck ROM: full    Dental  (+) Chipped   Pulmonary neg pulmonary ROS,    Pulmonary exam normal        Cardiovascular negative cardio ROS Normal cardiovascular exam     Neuro/Psych  Headaches,    GI/Hepatic Neg liver ROS, GERD  ,  Endo/Other  negative endocrine ROS  Renal/GU negative Renal ROS     Musculoskeletal   Abdominal   Peds  Hematology negative hematology ROS (+)   Anesthesia Other Findings   Reproductive/Obstetrics (+) Pregnancy                             Anesthesia Physical Anesthesia Plan  ASA: II  Anesthesia Plan: Epidural   Post-op Pain Management:    Induction:   Airway Management Planned:   Additional Equipment:   Intra-op Plan:   Post-operative Plan:   Informed Consent: I have reviewed the patients History and Physical, chart, labs and discussed the procedure including the risks, benefits and alternatives for the proposed anesthesia with the patient or authorized representative who has indicated his/her understanding and acceptance.     Plan Discussed with: Anesthesiologist and CRNA  Anesthesia Plan Comments:         Anesthesia Quick Evaluation

## 2016-06-12 NOTE — Progress Notes (Signed)
  Labor Progress Note   31 y.o. [id number] @ [redacted]w[redacted]d , admitted for  Pregnancy, Labor Management. Post Dates.  Subjective:  Rested well w Cervadil over night but feeling more ctxs in lower abd now.  No ROM or VB.  Objective:  BP 130/88 (BP Location: Left Arm)   Pulse 79   Temp 98.1 F (36.7 C) (Oral)   Resp 18   LMP 08/22/2015  Abd: mild Extr: trace to 1+ bilateral pedal edema SVE: deferred  EFM: FHR: 150 bpm, variability: moderate,  accelerations:  Present,  decelerations:  Absent Toco: irreg Labs: I have reviewed the patient's lab results.   Assessment & Plan:  G3P1011 @ [redacted]w[redacted]d, admitted for  Pregnancy and Labor/Delivery Management  1. Pain management: none. 2. FWB: FHT category 1.  3. ID: GBS positive 4. Labor management: ABX for GBS. Finish and remove Cervadil over next 2 hrs then start Pitocin. Analgesia or Epidural as desired.  All discussed with patient, see orders  Barnett Applebaum, MD, [facility], [facility] 06/12/2016  7:33 AM

## 2016-06-12 NOTE — Discharge Instructions (Signed)

## 2016-06-12 NOTE — Discharge Summary (Signed)
OB Discharge Summary     Patient Name: Vickie Gomez DOB: [birth year] MRN: [id number]  Date of admission: 06/11/2016 Delivering MD: Hoyt Koch, MD  Date of Delivery: 06/12/2016  Date of discharge: 06/13/2016  Admitting diagnosis: Induction  Intrauterine pregnancy: [redacted]w[redacted]d     Secondary diagnosis: None     Discharge diagnosis: Term Pregnancy Delivered                                                                                                Post partum procedures:none  Augmentation: Pitocin and Cervadil  Complications: None  Hospital course:  Induction of Labor With Vaginal Delivery   31 y.o. yo G3P1011 at [redacted]w[redacted]d was admitted to the hospital 06/11/2016 for induction of labor.  Indication for induction: Postdates.  Patient had an uncomplicated labor course as follows: Membrane Rupture Time/Date: 12:54 PM ,06/12/2016   Intrapartum Procedures: Episiotomy:                                           Lacerations:     Patient had delivery of a Viable infant.  Information for the patient's newborn:  Yocelyn, Brocious [[id number]]  Delivery Method: Vag-Spont  Delivery Note Primary OB: [facility] Delivery Physician: Barnett Applebaum, MD Gestational Age: Full term Antepartum complications: none Intrapartum complications: None  A viable Female was delivered via vertex perentation.  Apgars:8 ,9  Weight:  6 ob 11 oz, 3,030 grams   Placenta status: spontaneous and Intact.  Cord: 3+ vessels;  with the following complications: none.  Anesthesia:  epidural Episiotomy:  none Lacerations:  none Suture Repair: none Est. Blood Loss (mL):  less than 100 mL  Mom to postpartum.  Baby to Couplet care / Skin to Skin.06/12/2016   Details of delivery can be found in separate delivery note.  Patient had a routine postpartum course. Patient is discharged home 06/13/16.  Physical exam  Vitals:   06/13/16 0427 06/13/16 0818 06/13/16 1234 06/13/16 1644  BP: 107/61 123/82 117/65 129/73  Pulse:  (!) 57 78 70 73  Resp: 20 18 20 18   Temp: 97.7 F (36.5 C) 98.3 F (36.8 C) 98 F (36.7 C) 98.4 F (36.9 C)  TempSrc: Oral Oral Oral Oral  SpO2:  98%    Weight:      Height:       General: alert, cooperative and no distress Lochia: appropriate Uterine Fundus: firm Incision: N/A DVT Evaluation: No evidence of DVT seen on physical exam. No cords or calf tenderness. No significant calf/ankle edema.  Labs: Lab Results  Component Value Date   WBC 16.0 (H) 06/13/2016   HGB 9.7 (L) 06/13/2016   HCT 29.3 (L) 06/13/2016   MCV 87.4 06/13/2016   PLT 248 06/13/2016   CMP Latest Ref Rng & Units 05/13/2016  Glucose 65 - 99 mg/dL 94  BUN 6 - 20 mg/dL 10  Creatinine 0.44 - 1.00 mg/dL 0.55  Sodium 135 - 145 mmol/L 135  Potassium 3.5 - 5.1 mmol/L 3.7  Chloride 101 -  111 mmol/L 106  CO2 22 - 32 mmol/L 21(L)  Calcium 8.9 - 10.3 mg/dL 9.0  Total Protein 6.5 - 8.1 g/dL -  Total Bilirubin 0.3 - 1.2 mg/dL -  Alkaline Phos 38 - 126 U/L -  AST 15 - 41 U/L -  ALT 14 - 54 U/L -    Discharge instruction: per After Visit Summary.  Medications:  Allergies as of 06/13/2016      Reactions   Codeine Palpitations   Diphenhydramine Palpitations      Medication List    STOP taking these medications   TUCKS 50 % Pads     TAKE these medications   famotidine 20 MG tablet Commonly known as:  PEPCID Take 20 mg by mouth 2 (two) times daily.   ibuprofen 600 MG tablet Commonly known as:  ADVIL,MOTRIN Take 1 tablet (600 mg total) by mouth every 6 (six) hours.   prenatal multivitamin Tabs tablet Take 1 tablet by mouth daily at 12 noon.       Diet: routine diet  Activity: Advance as tolerated. Pelvic rest for 6 weeks.   Outpatient follow up: Follow-up Information    Hoyt Koch, MD. Schedule an appointment as soon as possible for a visit in 6 week(s).   Specialty:  Obstetrics and Gynecology Contact information: [address] [place] [address] [phone]              Postpartum contraception: Vasectomy Rhogam Given postpartum: no Rubella vaccine given postpartum: no Varicella vaccine given postpartum: no TDaP given antepartum or postpartum: AP  Newborn Data: Live born female  Birth Weight:  6 lb 11 oz, 3,030 grams APGAR: 8, 9    Baby Feeding: Breast  Disposition:home with mother  SIGNED:  Prentice Docker, MD 06/13/2016 7:05 PM

## 2016-06-13 ENCOUNTER — Encounter: Payer: Self-pay | Admitting: General Practice

## 2016-06-13 LAB — CBC
HCT: 29.3 % — ABNORMAL LOW (ref 35.0–47.0)
HEMOGLOBIN: 9.7 g/dL — AB (ref 12.0–16.0)
MCH: 29.1 pg (ref 26.0–34.0)
MCHC: 33.3 g/dL (ref 32.0–36.0)
MCV: 87.4 fL (ref 80.0–100.0)
PLATELETS: 248 10*3/uL (ref 150–440)
RBC: 3.35 MIL/uL — AB (ref 3.80–5.20)
RDW: 13.4 % (ref 11.5–14.5)
WBC: 16 10*3/uL — AB (ref 3.6–11.0)

## 2016-06-13 LAB — RPR: RPR Ser Ql: NONREACTIVE

## 2016-06-13 MED ORDER — IBUPROFEN 600 MG PO TABS: 600.0000 mg | ORAL_TABLET | Freq: Four times a day (QID) | ORAL | 0 refills | Status: DC

## 2016-06-13 NOTE — Progress Notes (Signed)
Patient ID: Vickie Gomez, female   DOB: 01/14/1986, 31 y.o.   MRN: [id number]  Obstetric Postpartum Daily Progress Note Subjective:  31 y.o. G3P1011 postpartum day #1 status post vaginal delivery.  She is ambulating, is tolerating po, is voiding spontaneously.  Her pain is well controlled on PO pain medications. Her lochia is less than menses.   Medications SCHEDULED MEDICATIONS  . ibuprofen  600 mg Oral Q6H  . senna-docusate  2 tablet Oral Q24H    MEDICATION INFUSIONS    PRN MEDICATIONS  acetaminophen, benzocaine-Menthol, coconut oil, witch hazel-glycerin **AND** dibucaine, diphenhydrAMINE, ondansetron **OR** ondansetron (ZOFRAN) IV, oxyCODONE-acetaminophen, oxyCODONE-acetaminophen, simethicone, zolpidem    Objective:   Vitals:   06/12/16 2036 06/12/16 2310 06/13/16 0427 06/13/16 0818  BP: 115/63 138/62 107/61 123/82  Pulse: 75 79 (!) 57 78  Resp: 18 18 20 18   Temp: 98.3 F (36.8 C) 99.4 F (37.4 C) 97.7 F (36.5 C) 98.3 F (36.8 C)  TempSrc: Oral Oral Oral Oral  SpO2: 99%   98%  Weight: 180 lb (81.6 kg)     Height: 5\' 3"  (1.6 m)       Current Vital Signs 24h Vital Sign Ranges  T 98.3 F (36.8 C) Temp  Avg: 98.1 F (36.7 C)  Min: 97.1 F (36.2 C)  Max: 99.4 F (37.4 C)  BP 123/82 BP  Min: 97/64  Max: 138/62  HR 78 Pulse  Avg: 84.4  Min: 57  Max: 111  RR 18 Resp  Avg: 18  Min: 16  Max: 20  SaO2 98 % Not Delivered SpO2  Avg: 98.5 %  Min: 98 %  Max: 99 %       24 Hour I/O Current Shift I/O  Time Ins Outs 04/26 0701 - 04/27 0700 In: 1523.4 [I.V.:1523.4] Out: -  No intake/output data recorded.  General: NAD Pulmonary: no increased work of breathing Abdomen: non-distended, non-tender, fundus firm at level of umbilicus Extremities: no edema, no erythema, no tenderness  Labs:   Recent Labs Lab 06/11/16 2007 06/13/16 0552  WBC 12.6* 16.0*  HGB 11.5* 9.7*  HCT 34.1* 29.3*  PLT 301 248     Assessment:   31 y.o. G3P1011 postpartum day # 1 status post SVD,  doing well  Plan:   1) Acute blood loss anemia - hemodynamically stable and asymptomatic - po ferrous sulfate  2) A POS / Rubella  / Varicella Immune  3) TDAP status up to date  4) breast /Contraception = vasectomy  5) Disposition: home later today, most likely. If not, tomorrow.   Prentice Docker, MD 06/13/2016 8:43 AM

## 2016-06-13 NOTE — Progress Notes (Signed)
Discharge instructions reviewed with pt. Questions answered.  Rx for ibuprofen given. Bracelets matched with baby. Mom taken down in wheelchair.

## 2016-06-13 NOTE — Anesthesia Postprocedure Evaluation (Signed)
Anesthesia Post Note  Patient: Vickie Gomez  Procedure(s) Performed: * No procedures listed *  Patient location during evaluation: Mother Baby Anesthesia Type: Epidural Level of consciousness: awake and alert Pain management: pain level controlled Vital Signs Assessment: post-procedure vital signs reviewed and stable Respiratory status: spontaneous breathing, nonlabored ventilation and respiratory function stable Cardiovascular status: stable Postop Assessment: no headache, no backache and epidural receding Anesthetic complications: no     Last Vitals:  Vitals:   06/12/16 2310 06/13/16 0427  BP: 138/62 107/61  Pulse: 79 (!) 57  Resp: 18 20  Temp: 37.4 C 36.5 C    Last Pain:  Vitals:   06/13/16 0427  TempSrc: Oral  PainSc:                  Brantley Fling

## 2016-06-18 ENCOUNTER — Emergency Department: Payer: Self-pay

## 2016-06-18 ENCOUNTER — Encounter: Payer: Self-pay | Admitting: Emergency Medicine

## 2016-06-18 ENCOUNTER — Emergency Department
Admission: EM | Admit: 2016-06-18 | Discharge: 2016-06-18 | Disposition: A | Discharge status: Home / Self Care | Payer: Self-pay | Attending: Emergency Medicine | Admitting: Emergency Medicine

## 2016-06-18 DIAGNOSIS — M79605 Pain in left leg: Secondary | ICD-10-CM | POA: Insufficient documentation

## 2016-06-18 NOTE — ED Notes (Signed)
Pt has breast pump at bedside.  Offered to help set up

## 2016-06-18 NOTE — Discharge Instructions (Signed)
Please see your doctor for repeat ultrasound in 3 days. Continue ibuprofen as directed by your doctor. Return to the ER for worsening symptoms, persistent vomiting, difficulty breathing or other concerns.

## 2016-06-18 NOTE — ED Triage Notes (Signed)
Pt to triage via w/c with no distress noted; vag delivery on Thursday, now with pain & swelling to left calf

## 2016-06-18 NOTE — ED Notes (Signed)
Call to [facility] for breast pump as pt feels engorged
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
## 2016-06-19 ENCOUNTER — Ambulatory Visit (INDEPENDENT_AMBULATORY_CARE_PROVIDER_SITE_OTHER): Payer: Self-pay | Admitting: Certified Nurse Midwife

## 2016-06-19 ENCOUNTER — Encounter: Payer: Self-pay | Admitting: Certified Nurse Midwife

## 2016-06-19 VITALS — BP 102/62 | HR 62 | Ht 63.0 in | Wt 164.0 lb

## 2016-06-19 DIAGNOSIS — M79662 Pain in left lower leg: Secondary | ICD-10-CM
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
## 2016-06-23 ENCOUNTER — Ambulatory Visit
Admission: RE | Admit: 2016-06-23 | Discharge: 2016-06-23 | Disposition: A | Discharge status: Home / Self Care | Payer: Self-pay | Source: Ambulatory Visit | Attending: Certified Nurse Midwife | Admitting: Certified Nurse Midwife

## 2016-06-23 DIAGNOSIS — M79662 Pain in left lower leg: Secondary | ICD-10-CM | POA: Insufficient documentation

## 2016-07-05 DIAGNOSIS — N61 Mastitis without abscess: Secondary | ICD-10-CM | POA: Insufficient documentation

## 2016-07-05 LAB — CBC WITH DIFFERENTIAL/PLATELET
BASOS ABS: 0.1 10*3/uL (ref 0–0.1)
BASOS PCT: 1 %
Eosinophils Absolute: 0.2 10*3/uL (ref 0–0.7)
Eosinophils Relative: 1 %
HEMATOCRIT: 37.7 % (ref 35.0–47.0)
HEMOGLOBIN: 12.7 g/dL (ref 12.0–16.0)
LYMPHS PCT: 14 %
Lymphs Abs: 2.2 10*3/uL (ref 1.0–3.6)
MCH: 29.3 pg (ref 26.0–34.0)
MCHC: 33.6 g/dL (ref 32.0–36.0)
MCV: 87.2 fL (ref 80.0–100.0)
MONOS PCT: 8 %
Monocytes Absolute: 1.2 10*3/uL — ABNORMAL HIGH (ref 0.2–0.9)
NEUTROS ABS: 12 10*3/uL — AB (ref 1.4–6.5)
NEUTROS PCT: 76 %
Platelets: 299 10*3/uL (ref 150–440)
RBC: 4.33 MIL/uL (ref 3.80–5.20)
RDW: 13.4 % (ref 11.5–14.5)
WBC: 15.6 10*3/uL — ABNORMAL HIGH (ref 3.6–11.0)

## 2016-07-05 LAB — COMPREHENSIVE METABOLIC PANEL
ALBUMIN: 3.9 g/dL (ref 3.5–5.0)
ALK PHOS: 66 U/L (ref 38–126)
ALT: 17 U/L (ref 14–54)
AST: 16 U/L (ref 15–41)
Anion gap: 9 (ref 5–15)
BILIRUBIN TOTAL: 0.5 mg/dL (ref 0.3–1.2)
BUN: 17 mg/dL (ref 6–20)
CALCIUM: 8.7 mg/dL — AB (ref 8.9–10.3)
CO2: 23 mmol/L (ref 22–32)
Chloride: 105 mmol/L (ref 101–111)
Creatinine, Ser: 0.89 mg/dL (ref 0.44–1.00)
GFR calc Af Amer: >60 mL/min (ref >60)
GLUCOSE: 110 mg/dL — AB (ref 65–99)
Potassium: 3.5 mmol/L (ref 3.5–5.1)
Sodium: 137 mmol/L (ref 135–145)
TOTAL PROTEIN: 7.6 g/dL (ref 6.5–8.1)

## 2016-07-05 NOTE — ED Triage Notes (Signed)
Patient reports woke this morning with left breast pain.  Thought it was because it was time for baby to eat, but remained swollen and painful.  Reports now continued pain and swelling with redness.

## 2016-07-06 ENCOUNTER — Emergency Department
Admission: EM | Admit: 2016-07-06 | Discharge: 2016-07-06 | Disposition: A | Discharge status: Home / Self Care | Payer: Self-pay | Attending: Emergency Medicine | Admitting: Emergency Medicine

## 2016-07-06 DIAGNOSIS — N61 Mastitis without abscess: Secondary | ICD-10-CM

## 2016-07-06 MED ORDER — AMOXICILLIN 500 MG PO CAPS: 500.0000 mg | ORAL_CAPSULE | Freq: Three times a day (TID) | ORAL | 0 refills | Status: DC

## 2016-07-06 MED ORDER — CEFTRIAXONE SODIUM 250 MG IJ SOLR
250.0000 mg | Freq: Once | INTRAMUSCULAR | Status: AC
  Administered 2016-07-06: 250 mg via INTRAMUSCULAR
  Filled 2016-07-06: qty 250

## 2016-07-06 NOTE — ED Notes (Signed)
Pt. states she is breast feeding and today her left breast started causing her pain.  She says she expresses when she is unable to breast feed and that she is still able to feed the baby with her left breast but it is painful.  She has taken ibuprofen only at home with no relief.
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
## 2016-07-06 NOTE — Discharge Instructions (Signed)
1. Take antibiotic as prescribed (amoxicillin 500 mg 3 times daily 7 days). 2. You may continue Tylenol and/or ibuprofen as needed for fever and pain. 3. Return to the ER for worsening symptoms, persistent vomiting, difficulty breathing or other concerns.

## 2016-07-17 ENCOUNTER — Ambulatory Visit: Payer: Self-pay | Admitting: Obstetrics & Gynecology

## 2016-09-03 ENCOUNTER — Ambulatory Visit (INDEPENDENT_AMBULATORY_CARE_PROVIDER_SITE_OTHER): Payer: Self-pay | Admitting: Obstetrics & Gynecology

## 2016-09-03 DIAGNOSIS — Z87410 Personal history of cervical dysplasia: Secondary | ICD-10-CM

## 2016-09-03 NOTE — Progress Notes (Signed)
  OBSTETRICS POSTPARTUM CLINIC PROGRESS NOTE  Subjective:     Vickie Gomez is a 31 y.o. G10P2012 female who presents for a postpartum visit. She is 6 weeks postpartum following a Term pregnancy and delivery by Vaginal, no problems at delivery.  I have fully reviewed the prenatal and intrapartum course. Anesthesia: epidural.  Postpartum course has been complicated by uncomplicated.  Baby is feeding by Breast.  Bleeding: patient has not  resumed menses.  Bowel function is normal. Bladder function is normal.  Patient is not sexually active. Contraception method desired is vasectomy.  Postpartum depression screening: negative. Edinburgh 5. Some anxiety. Prior ASCUS  The following portions of the patient's history were reviewed and updated as appropriate: allergies, current medications, past family history, past medical history, past social history, past surgical history and problem list.  Review of Systems Pertinent items are noted in HPI.  Objective:    BP 100/70   Pulse 85   Ht 5\' 3"  (1.6 m)   Wt 148 lb (67.1 kg)   BMI 26.22 kg/m   General:  alert and no distress   Breasts:  inspection negative, no nipple discharge or bleeding, no masses or nodularity palpable  Lungs: clear to auscultation bilaterally  Heart:  regular rate and rhythm, S1, S2 normal, no murmur, click, rub or gallop  Abdomen: soft, non-tender; bowel sounds normal; no masses,  no organomegaly.     Vulva:  normal  Vagina: normal vagina, no discharge, exudate, lesion, or erythema  Cervix:  no cervical motion tenderness and no lesions  Corpus: normal size, contour, position, consistency, mobility, non-tender  Adnexa:  normal adnexa and no mass, fullness, tenderness  Rectal Exam: Not performed.          Assessment:  Post Partum Care visit 1. Postpartum care following vaginal delivery  Plan:  See orders and Patient Instructions Contraceptive counseling for condoms, plans vasec Resume all normal  activities Follow up in: 1 yr or as needed.   Barnett Applebaum, MD, [facility], [facility] 09/03/2016  3:12 PM

## 2016-09-17 LAB — IGP, APTIMA HPV
HPV Aptima: NEGATIVE
PAP Smear Comment: 0

## 2017-04-17 ENCOUNTER — Encounter: Payer: Self-pay | Admitting: *Deleted

## 2017-04-17 ENCOUNTER — Ambulatory Visit
Admission: EM | Admit: 2017-04-17 | Discharge: 2017-04-17 | Disposition: A | Discharge status: Home / Self Care | Payer: Self-pay | Attending: Family Medicine | Admitting: Family Medicine

## 2017-04-17 DIAGNOSIS — B9789 Other viral agents as the cause of diseases classified elsewhere: Secondary | ICD-10-CM

## 2017-04-17 DIAGNOSIS — J069 Acute upper respiratory infection, unspecified: Secondary | ICD-10-CM

## 2017-04-17 MED ORDER — PREDNISONE 20 MG PO TABS: 20.0000 mg | ORAL_TABLET | Freq: Every day | ORAL | 0 refills | Status: DC
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
## 2017-04-17 NOTE — ED Triage Notes (Signed)
C/O cough, with some SOB, dizziness and weakness stared having symptoms Sunday. Pt has not taking any medication since she is breastfeeding.

## 2017-10-09 IMAGING — US US EXTREM LOW VENOUS*L*
1 series · 13 of 24 positions shown · non-contrast
Comparison: None.

CLINICAL DATA: Initial evaluation for acute pain and swelling in
the left lower extremity.



[Series 1: us extrem low venous*left* · 0.06mm/px · 13 of 45 slices shown]
[im 1/45]
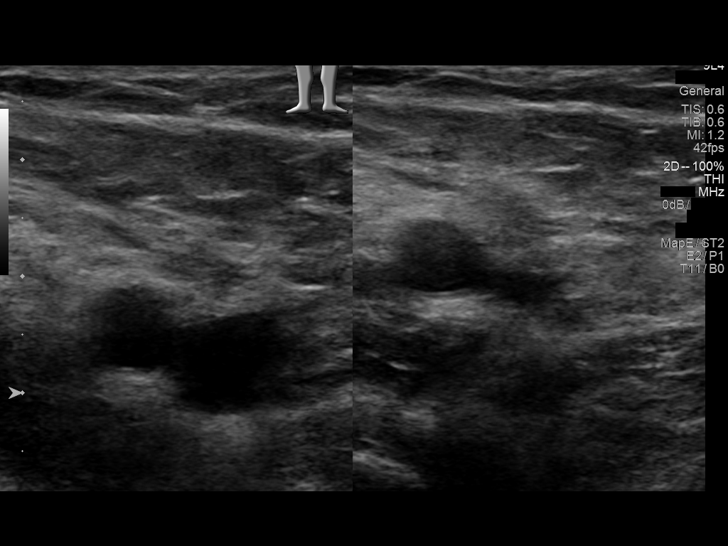
[im 4/45]
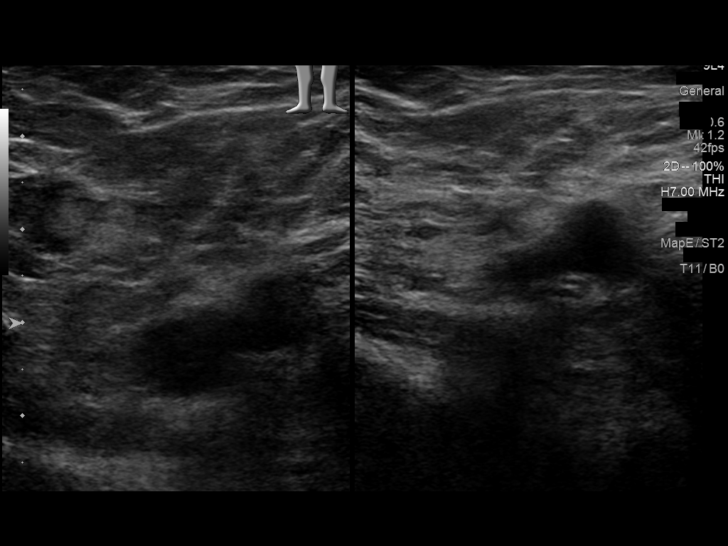
[im 8/45]
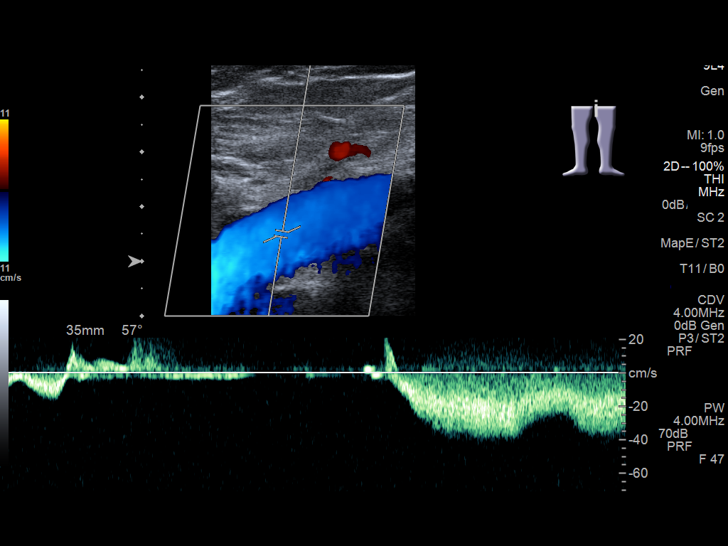
[im 12/45]
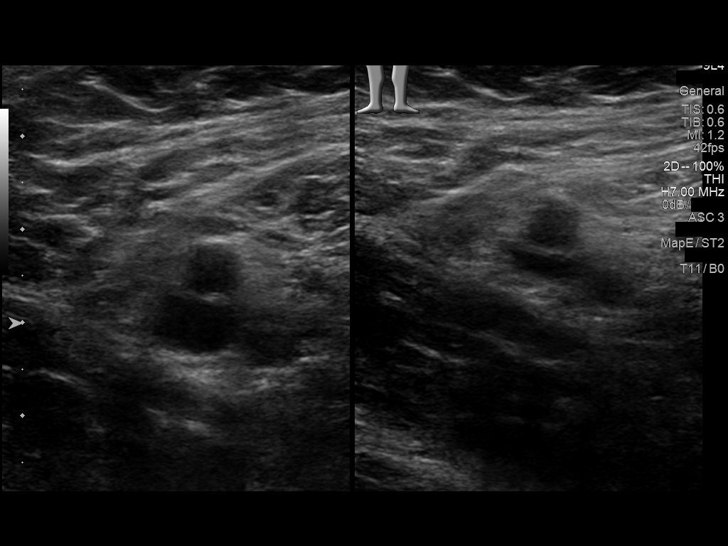
[im 16/45]
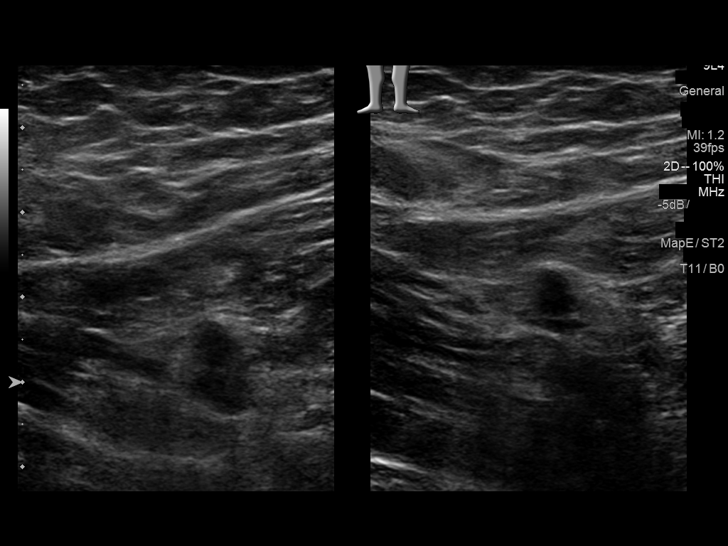
[im 20/45]
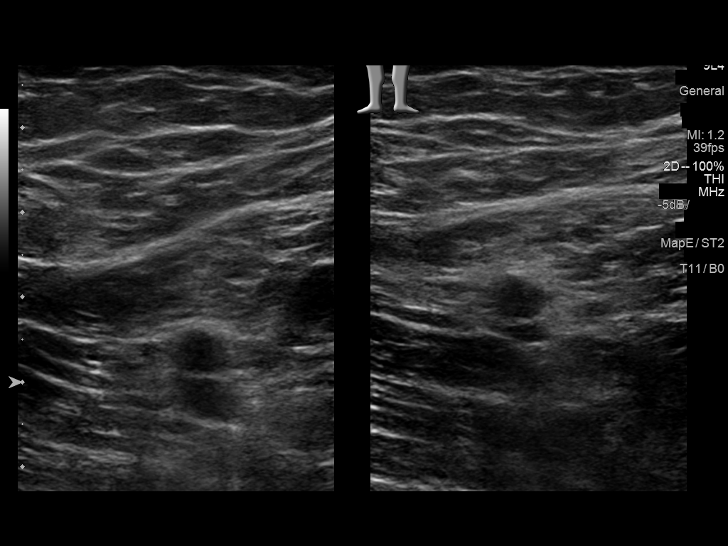
[im 23/45]
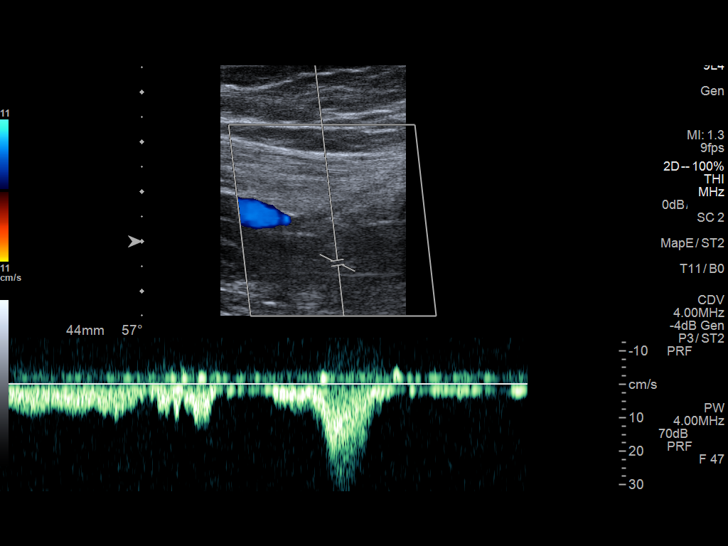
[im 25/45]
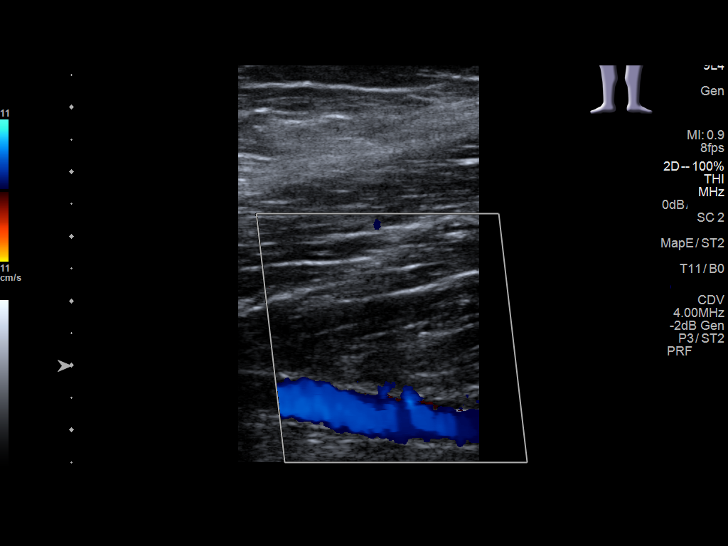
[im 29/45]
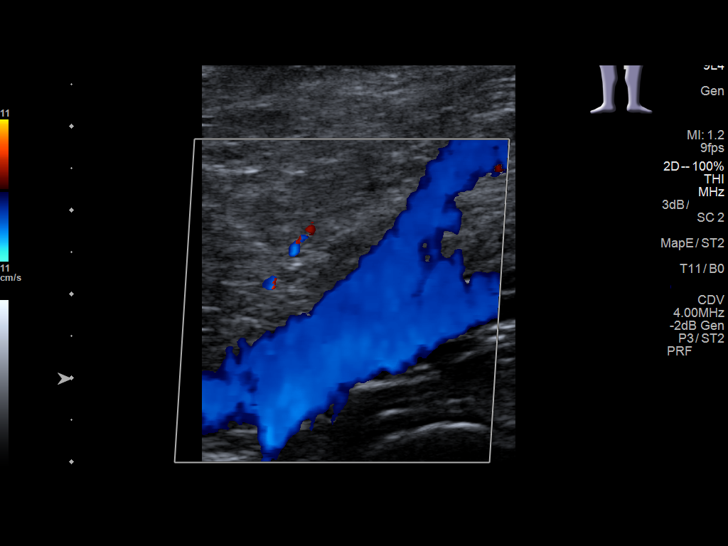
[im 33/45]
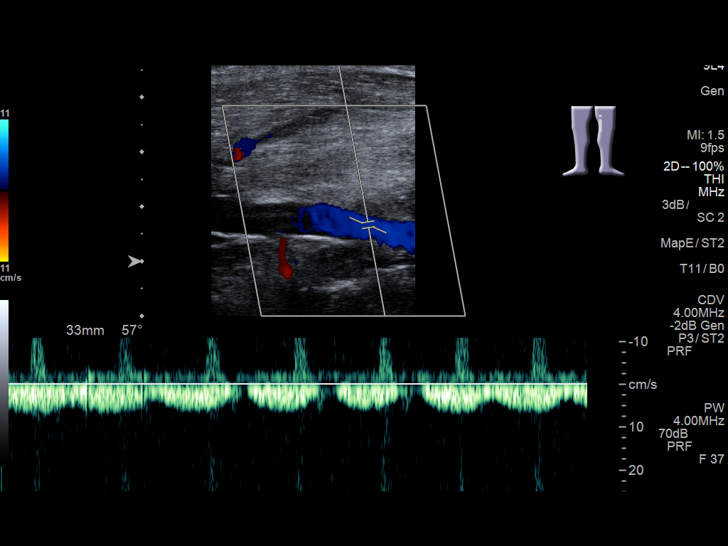
[im 37/45]
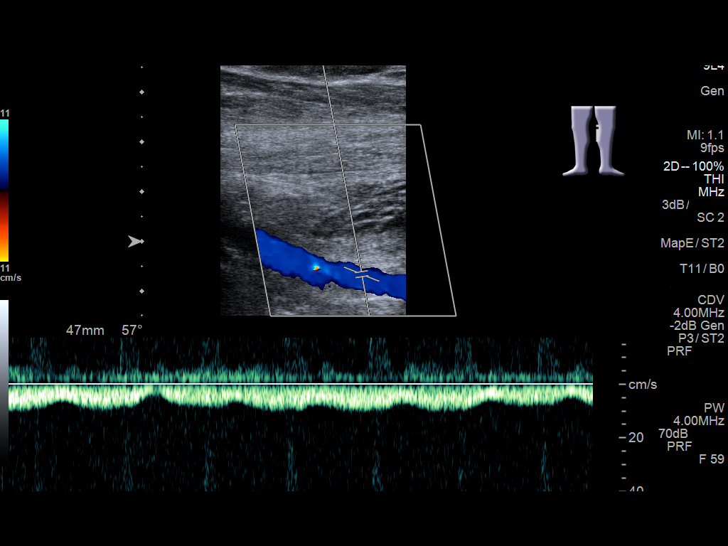
[im 41/45]
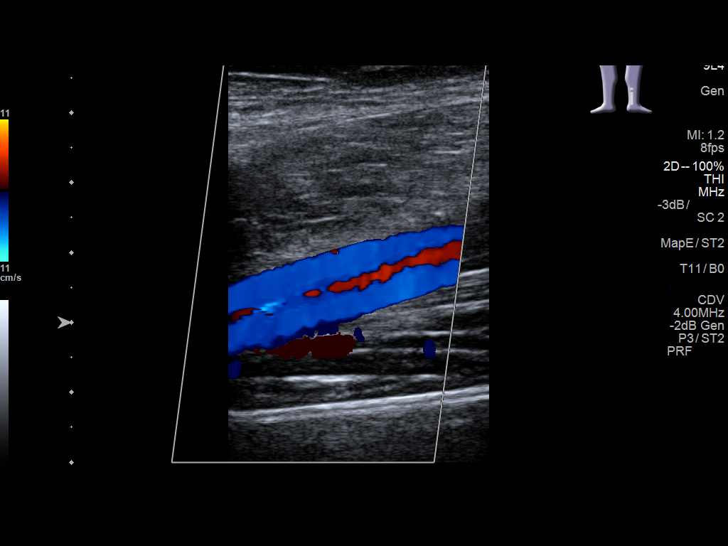
[im 45/45]
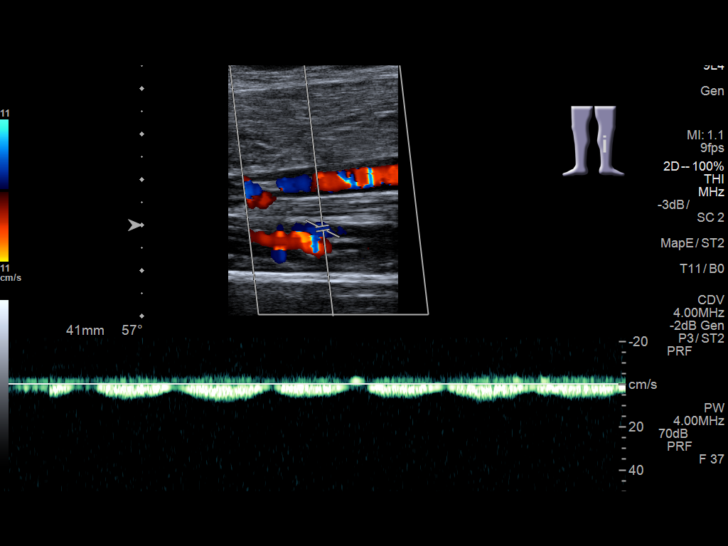

[13 of 24 positions shown; findings below may reference images not displayed]

FINDINGS: Contralateral Common Femoral Vein: Respiratory phasicity is normal
and symmetric with the symptomatic side. No evidence of thrombus.
Normal compressibility.

Common Femoral Vein: No evidence of thrombus. Normal
compressibility, respiratory phasicity and response to augmentation.

Saphenofemoral Junction: No evidence of thrombus. Normal
compressibility and flow on color Doppler imaging.

Profunda Femoral Vein: No evidence of thrombus. Normal
compressibility and flow on color Doppler imaging.

Femoral Vein: No evidence of thrombus. Normal compressibility,
respiratory phasicity and response to augmentation.

Popliteal Vein: No evidence of thrombus. Normal compressibility,
respiratory phasicity and response to augmentation.

Calf Veins: No evidence of thrombus. Normal compressibility and flow
on color Doppler imaging.

Superficial Great Saphenous Vein: No evidence of thrombus. Normal
compressibility and flow on color Doppler imaging.

Venous Reflux:  None.

Other Findings:  None.
IMPRESSION: No evidence of deep venous thrombosis.

## 2017-10-14 IMAGING — US US EXTREM LOW VENOUS*L*
1 series · 13 of 24 positions shown · non-contrast
Comparison: 06/18/2016

CLINICAL DATA: Left calf pain since giving birth on 06/14/2016.



[Series 1: us extrem low venous*left* · 0.06mm/px · 13 of 33 slices shown]
[im 1/33]
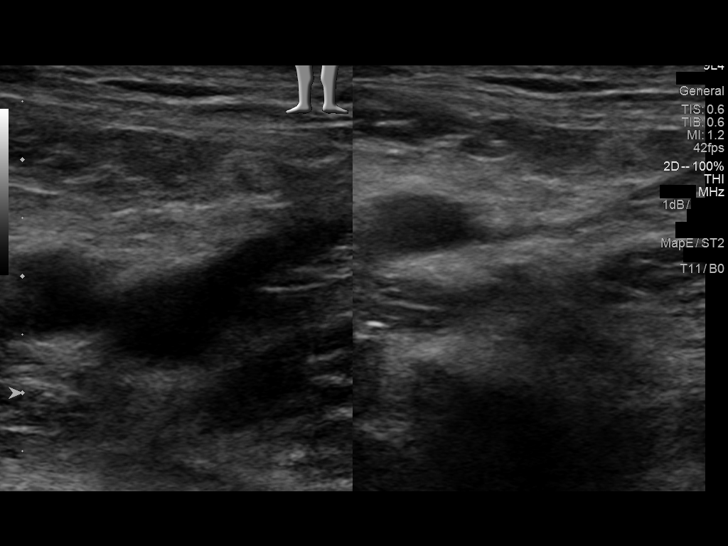
[im 3/33]
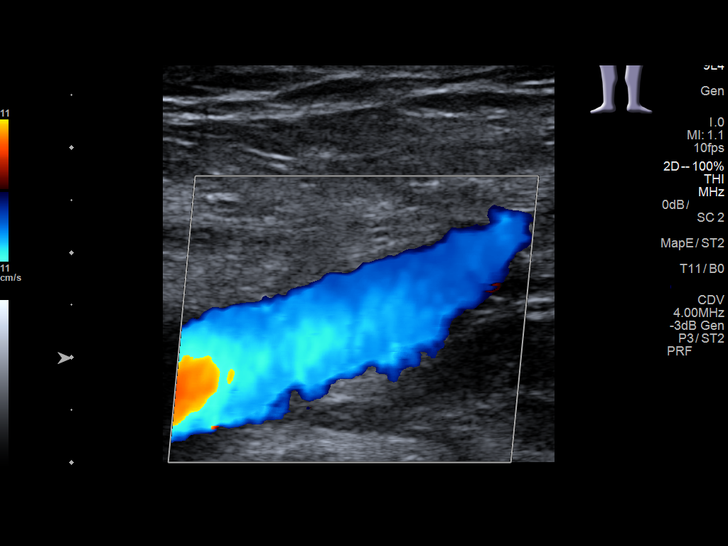
[im 6/33]
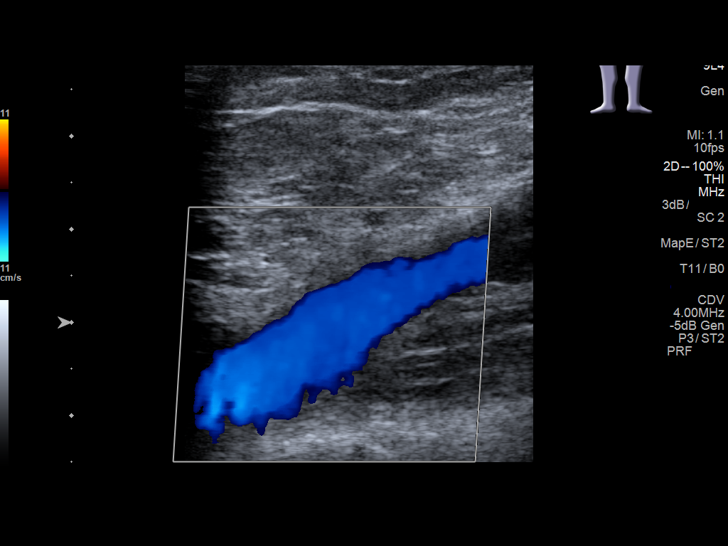
[im 9/33]
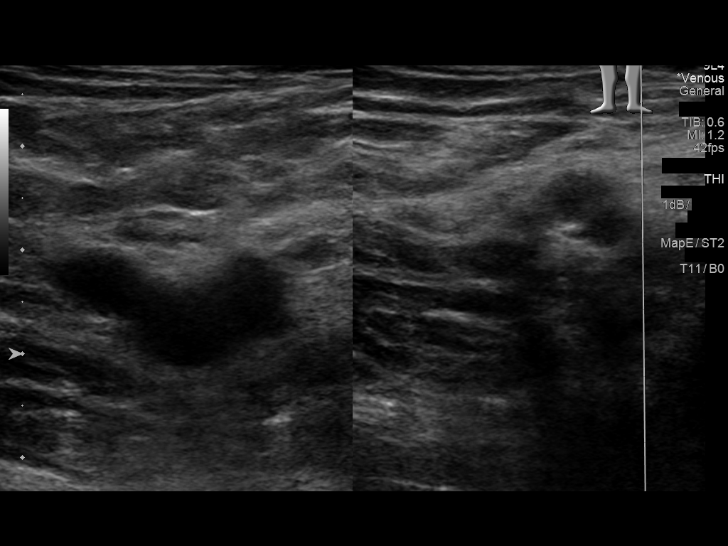
[im 12/33]
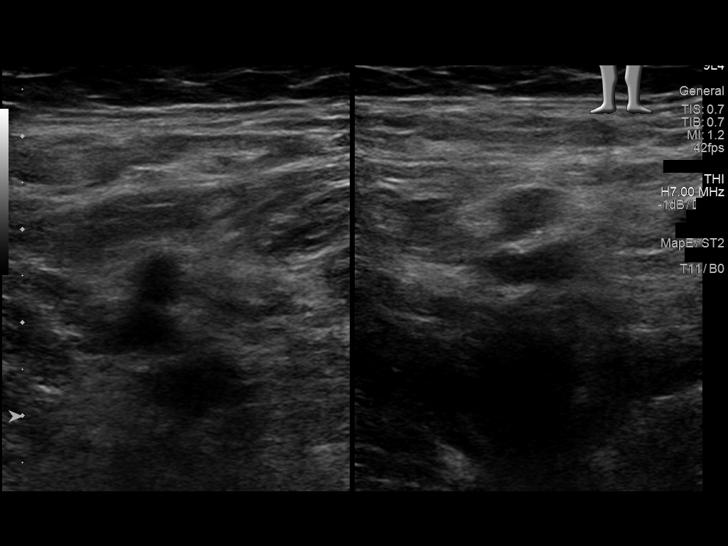
[im 14/33]
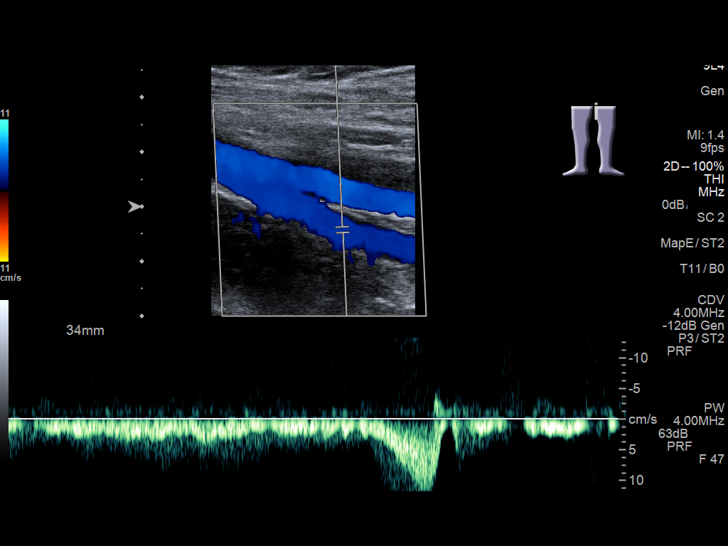
[im 17/33]
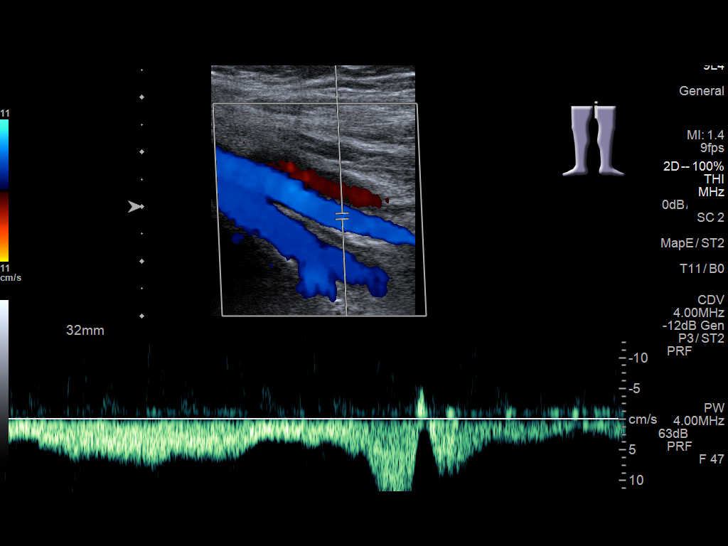
[im 19/33]
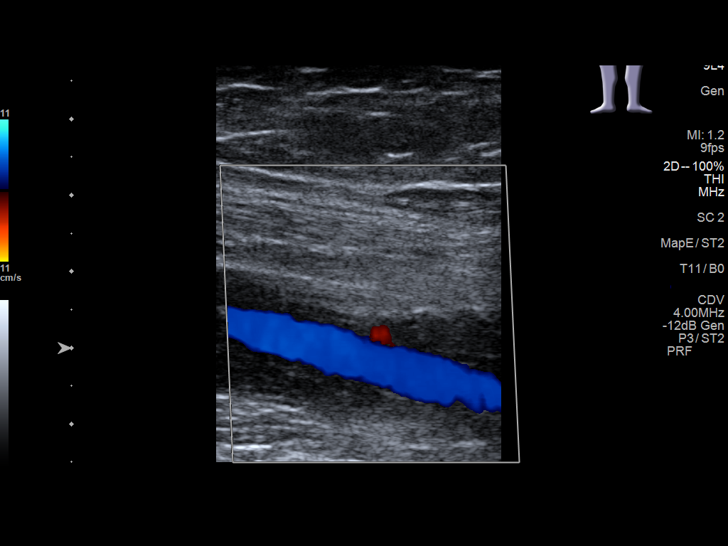
[im 21/33]
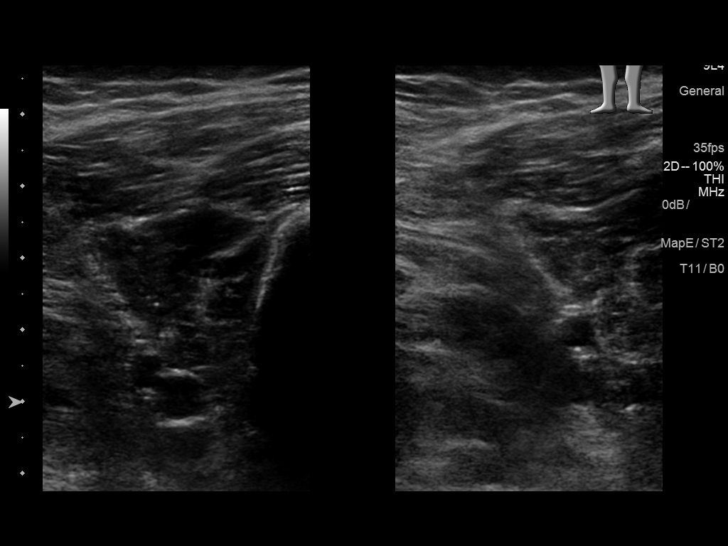
[im 24/33]
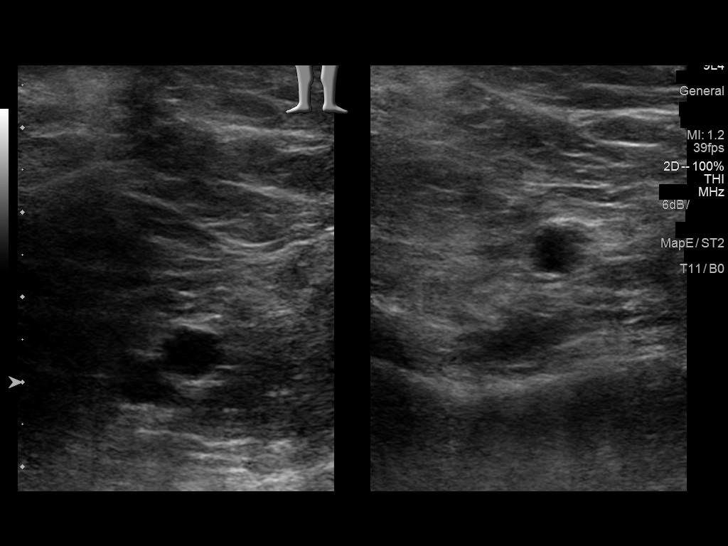
[im 27/33]
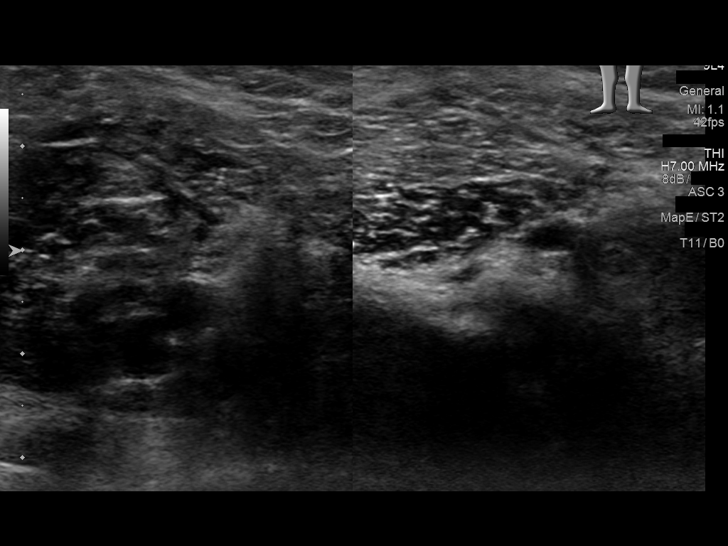
[im 30/33]
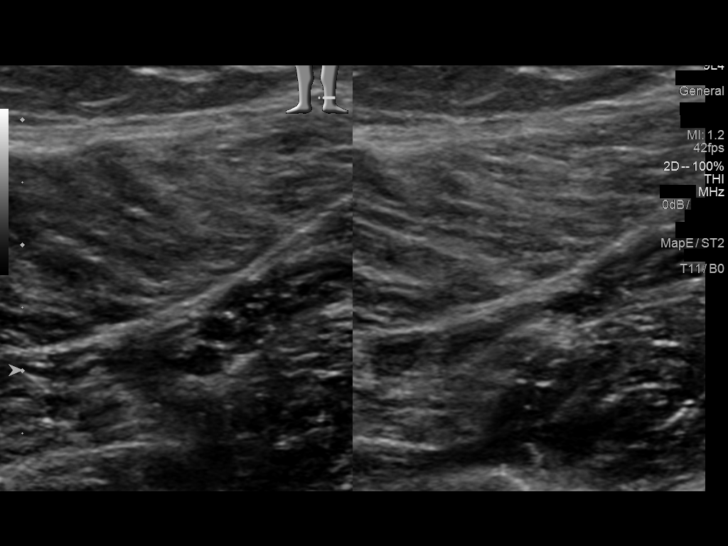
[im 33/33]
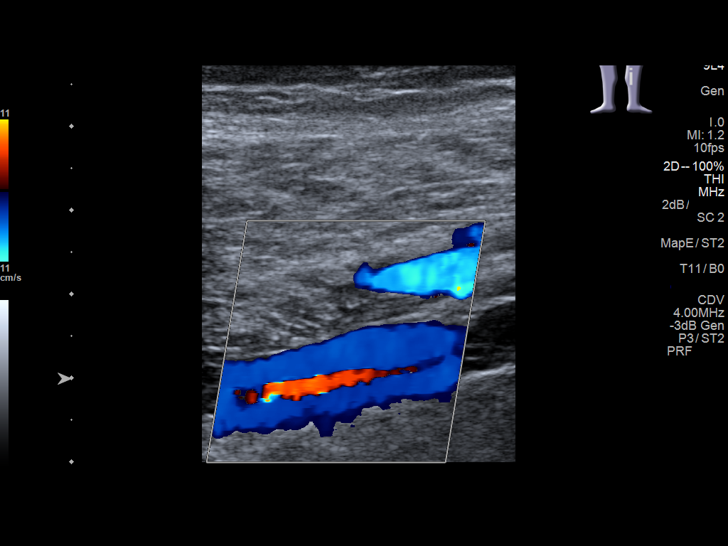

[13 of 24 positions shown; findings below may reference images not displayed]

FINDINGS: Contralateral Common Femoral Vein: Respiratory phasicity is normal
and symmetric with the symptomatic side. No evidence of thrombus.
Normal compressibility.

Common Femoral Vein: No evidence of thrombus. Normal
compressibility, respiratory phasicity and response to augmentation.

Saphenofemoral Junction: No evidence of thrombus. Normal
compressibility and flow on color Doppler imaging.

Profunda Femoral Vein: No evidence of thrombus. Normal
compressibility and flow on color Doppler imaging.

Femoral Vein: No evidence of thrombus. Normal compressibility,
respiratory phasicity and response to augmentation.

Popliteal Vein: No evidence of thrombus. Normal compressibility,
respiratory phasicity and response to augmentation.

Calf Veins: No evidence of thrombus. Normal compressibility and flow
on color Doppler imaging.

Superficial Great Saphenous Vein: No evidence of thrombus. Normal
compressibility and flow on color Doppler imaging.

Venous Reflux:  None.

Other Findings:  None.
IMPRESSION: No evidence of DVT within the left lower extremity.

## 2018-04-05 ENCOUNTER — Emergency Department
Admission: EM | Admit: 2018-04-05 | Discharge: 2018-04-05 | Disposition: A | Discharge status: Home / Self Care | Payer: Self-pay | Attending: Emergency Medicine | Admitting: Emergency Medicine

## 2018-04-05 ENCOUNTER — Emergency Department: Payer: Self-pay

## 2018-04-05 ENCOUNTER — Other Ambulatory Visit: Payer: Self-pay

## 2018-04-05 DIAGNOSIS — R102 Pelvic and perineal pain: Secondary | ICD-10-CM | POA: Insufficient documentation

## 2018-04-05 DIAGNOSIS — N83209 Unspecified ovarian cyst, unspecified side: Secondary | ICD-10-CM | POA: Insufficient documentation

## 2018-04-05 LAB — CBC
HCT: 41.3 % (ref 36.0–46.0)
HEMOGLOBIN: 13.6 g/dL (ref 12.0–15.0)
MCH: 30.2 pg (ref 26.0–34.0)
MCHC: 32.9 g/dL (ref 30.0–36.0)
MCV: 91.6 fL (ref 80.0–100.0)
Platelets: 325 10*3/uL (ref 150–400)
RBC: 4.51 MIL/uL (ref 3.87–5.11)
RDW: 12 % (ref 11.5–15.5)
WBC: 8.2 10*3/uL (ref 4.0–10.5)
nRBC: 0 % (ref 0.0–0.2)

## 2018-04-05 LAB — URINALYSIS, COMPLETE (UACMP) WITH MICROSCOPIC
BACTERIA UA: NONE SEEN
Bilirubin Urine: NEGATIVE
GLUCOSE, UA: NEGATIVE mg/dL
Hgb urine dipstick: NEGATIVE
KETONES UR: NEGATIVE mg/dL
Leukocytes,Ua: NEGATIVE
Nitrite: NEGATIVE
PROTEIN: NEGATIVE mg/dL
Specific Gravity, Urine: 1.02 (ref 1.005–1.030)
pH: 6 (ref 5.0–8.0)

## 2018-04-05 LAB — COMPREHENSIVE METABOLIC PANEL
ALBUMIN: 4.8 g/dL (ref 3.5–5.0)
ALK PHOS: 36 U/L — AB (ref 38–126)
ALT: 12 U/L (ref 0–44)
ANION GAP: 5 (ref 5–15)
AST: 15 U/L (ref 15–41)
BILIRUBIN TOTAL: 0.9 mg/dL (ref 0.3–1.2)
BUN: 13 mg/dL (ref 6–20)
CO2: 24 mmol/L (ref 22–32)
Calcium: 8.9 mg/dL (ref 8.9–10.3)
Chloride: 106 mmol/L (ref 98–111)
Creatinine, Ser: 0.76 mg/dL (ref 0.44–1.00)
GFR calc Af Amer: >60 mL/min (ref >60)
GFR calc non Af Amer: >60 mL/min (ref >60)
GLUCOSE: 91 mg/dL (ref 70–99)
POTASSIUM: 3.7 mmol/L (ref 3.5–5.1)
SODIUM: 135 mmol/L (ref 135–145)
Total Protein: 8.4 g/dL — ABNORMAL HIGH (ref 6.5–8.1)

## 2018-04-05 LAB — LIPASE, BLOOD: Lipase: 34 U/L (ref 11–51)

## 2018-04-05 LAB — POCT PREGNANCY, URINE: PREG TEST UR: NEGATIVE

## 2018-04-05 MED ORDER — KETOROLAC TROMETHAMINE 60 MG/2ML IM SOLN
60.0000 mg | Freq: Once | INTRAMUSCULAR | Status: AC
  Administered 2018-04-05: 60 mg via INTRAMUSCULAR
  Filled 2018-04-05: qty 2

## 2018-04-05 MED ORDER — HYDROCODONE-ACETAMINOPHEN 5-325 MG PO TABS: 1.0000 | ORAL_TABLET | Freq: Four times a day (QID) | ORAL | 0 refills | Status: DC | PRN

## 2018-04-05 MED ORDER — OXYCODONE-ACETAMINOPHEN 5-325 MG PO TABS
2.0000 | ORAL_TABLET | Freq: Once | ORAL | Status: AC
  Administered 2018-04-05: 2 via ORAL
  Filled 2018-04-05: qty 2

## 2018-04-05 NOTE — ED Triage Notes (Signed)
RLQ pain radiating to pelvis. Hx of ovarian cyst in right ovary. Pt alert and oriented X4, active, cooperative, pt in NAD. RR even and unlabored, color WNL.  NV.
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
## 2018-05-25 DIAGNOSIS — S61201A Unspecified open wound of left index finger without damage to nail, initial encounter: Secondary | ICD-10-CM | POA: Insufficient documentation

## 2018-06-08 DIAGNOSIS — R200 Anesthesia of skin: Secondary | ICD-10-CM | POA: Insufficient documentation

## 2020-01-18 ENCOUNTER — Other Ambulatory Visit: Payer: Self-pay

## 2020-01-18 DIAGNOSIS — N939 Abnormal uterine and vaginal bleeding, unspecified: Secondary | ICD-10-CM | POA: Insufficient documentation

## 2020-01-18 DIAGNOSIS — R1030 Lower abdominal pain, unspecified: Secondary | ICD-10-CM | POA: Insufficient documentation

## 2020-01-18 DIAGNOSIS — R350 Frequency of micturition: Secondary | ICD-10-CM | POA: Insufficient documentation

## 2020-01-18 LAB — CBC WITH DIFFERENTIAL/PLATELET
Abs Immature Granulocytes: 0.04 10*3/uL (ref 0.00–0.07)
Basophils Absolute: 0.1 10*3/uL (ref 0.0–0.1)
Basophils Relative: 1 %
Eosinophils Absolute: 0.3 10*3/uL (ref 0.0–0.5)
Eosinophils Relative: 2 %
HCT: 38.8 % (ref 36.0–46.0)
Hemoglobin: 12.9 g/dL (ref 12.0–15.0)
Immature Granulocytes: 0 %
Lymphocytes Relative: 31 %
Lymphs Abs: 4 10*3/uL (ref 0.7–4.0)
MCH: 31.1 pg (ref 26.0–34.0)
MCHC: 33.2 g/dL (ref 30.0–36.0)
MCV: 93.5 fL (ref 80.0–100.0)
Monocytes Absolute: 1.2 10*3/uL — ABNORMAL HIGH (ref 0.1–1.0)
Monocytes Relative: 9 %
Neutro Abs: 7.5 10*3/uL (ref 1.7–7.7)
Neutrophils Relative %: 57 %
Platelets: 320 10*3/uL (ref 150–400)
RBC: 4.15 MIL/uL (ref 3.87–5.11)
RDW: 12.1 % (ref 11.5–15.5)
WBC: 13 10*3/uL — ABNORMAL HIGH (ref 4.0–10.5)
nRBC: 0 % (ref 0.0–0.2)

## 2020-01-18 LAB — URINALYSIS, COMPLETE (UACMP) WITH MICROSCOPIC
Bilirubin Urine: NEGATIVE
Glucose, UA: NEGATIVE mg/dL
Ketones, ur: NEGATIVE mg/dL
Leukocytes,Ua: NEGATIVE
Nitrite: NEGATIVE
Protein, ur: NEGATIVE mg/dL
RBC / HPF: >50 RBC/hpf — ABNORMAL HIGH (ref 0–5)
Specific Gravity, Urine: 1.006 (ref 1.005–1.030)
pH: 5 (ref 5.0–8.0)

## 2020-01-18 LAB — HCG, QUANTITATIVE, PREGNANCY: hCG, Beta Chain, Quant, S: <1 m[IU]/mL (ref <5)

## 2020-01-18 LAB — COMPREHENSIVE METABOLIC PANEL
ALT: 11 U/L (ref 0–44)
AST: 14 U/L — ABNORMAL LOW (ref 15–41)
Albumin: 4.4 g/dL (ref 3.5–5.0)
Alkaline Phosphatase: 42 U/L (ref 38–126)
Anion gap: 6 (ref 5–15)
BUN: 10 mg/dL (ref 6–20)
CO2: 26 mmol/L (ref 22–32)
Calcium: 8.8 mg/dL — ABNORMAL LOW (ref 8.9–10.3)
Chloride: 105 mmol/L (ref 98–111)
Creatinine, Ser: 0.75 mg/dL (ref 0.44–1.00)
GFR, Estimated: >60 mL/min (ref >60)
Glucose, Bld: 105 mg/dL — ABNORMAL HIGH (ref 70–99)
Potassium: 3.6 mmol/L (ref 3.5–5.1)
Sodium: 137 mmol/L (ref 135–145)
Total Bilirubin: 0.6 mg/dL (ref 0.3–1.2)
Total Protein: 8 g/dL (ref 6.5–8.1)

## 2020-01-18 LAB — POC URINE PREG, ED: Preg Test, Ur: NEGATIVE

## 2020-01-18 LAB — ABO/RH: ABO/RH(D): A POS

## 2020-01-18 NOTE — ED Triage Notes (Signed)
Pt to ED from home c/o spotting and vaginal bleeding today.   States missed periord 3 weeks ago and took home pregnancy test and was positive.  States lower abd cramping.  2 other children at home.  Some urinary frequency, denies SOB, A&Ox4, skin WNL, in NAD at this time.

## 2020-01-19 ENCOUNTER — Emergency Department
Admission: EM | Admit: 2020-01-19 | Discharge: 2020-01-19 | Disposition: A | Discharge status: Home / Self Care | Payer: Self-pay | Attending: Emergency Medicine | Admitting: Emergency Medicine

## 2020-01-19 ENCOUNTER — Emergency Department: Payer: Self-pay

## 2020-01-19 DIAGNOSIS — N939 Abnormal uterine and vaginal bleeding, unspecified: Secondary | ICD-10-CM
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
## 2020-01-19 NOTE — Discharge Instructions (Signed)
As we discussed, both the urine and blood pregnancy test today were negative.  The rest of your lab work was reassuring.  Your ultrasound did not show any sign of pregnancy nor any specific abnormality other than a small amount of pelvic free fluid and "adnexal varices" which is a nonspecific finding that does not indicate an emergent abnormality.  We recommend you follow-up with [facility] at your convenience for a follow-up appointment.  Return to the emergency department if you develop new or worsening symptoms that concern you.

## 2020-01-23 ENCOUNTER — Ambulatory Visit (INDEPENDENT_AMBULATORY_CARE_PROVIDER_SITE_OTHER): Payer: Self-pay | Admitting: Obstetrics and Gynecology

## 2020-01-23 ENCOUNTER — Other Ambulatory Visit: Payer: Self-pay

## 2020-01-23 ENCOUNTER — Encounter: Payer: Self-pay | Admitting: Obstetrics and Gynecology

## 2020-01-23 VITALS — BP 118/70 | Ht 63.0 in | Wt 160.0 lb

## 2020-01-23 DIAGNOSIS — F418 Other specified anxiety disorders: Secondary | ICD-10-CM

## 2020-01-23 DIAGNOSIS — N926 Irregular menstruation, unspecified: Secondary | ICD-10-CM

## 2020-01-23 LAB — POCT URINE PREGNANCY: Preg Test, Ur: NEGATIVE

## 2020-01-23 MED ORDER — ESCITALOPRAM OXALATE 10 MG PO TABS: 10.0000 mg | ORAL_TABLET | Freq: Every day | ORAL | 3 refills | Status: DC

## 2020-01-23 NOTE — Patient Instructions (Addendum)
[place], [place] Same Day Access Hours:  Monday, Wednesday and Friday, 8am - 3pm Walk-In Crisis Hours: 7 days/wk, 8am - 8pm [address], [place], [place] [address] [phone]  [employer] [phone]  Mobile Crisis Unit [phone]   Therapists/Counselors/Psychologists  [facility] [address], [facility] [address] [place], [place] [address] [phone]  Karen [place], [place]    [phone]        [place], [place] [address]        Peggye Ley, [place] [phone] [place], [place] [address]  Lady Deutscher, [place]        [phone]        [address], [address]      [place], [place] [address]        Bettey Mare, [place] [phone] [address] [place], [place] [address]   Dimas Aguas, LMFT       [phone]        [address] [place], [place] [address]        Miguel Dibble [phone] [address] [place], [place] [address]  Rhona Raider        [phone]        [address]       [place], [place] [address]        Sena Hitch [phone] [address] [place], [place] [address]  Lestine Box, [place]       [phone]        [address]       [place], [place] [address]        Marjie Skiff, [place] [phone] [address]  [place], [place] [address]   Einar Gip        [phone]        [address] [place], [place] [address]         [place] [phone] [email or web address]   Sation Konchella       [phone]        [address], [place] [address]        [place] [phone] [email or web address]   ADD/ADHD Testing:   Haig Prophet, PhD [address] [place], [place] [address] [phone] [facility] in [place],  [place], [place], [place] [address] Phone: [phone] Fax: [phone] [place] , [place], Medicaid   [employer] [place], [place] [address] [phone]    [place] [facility] in [address], [place]   ----------------------------------------------------------   Neuropsych Testing Dr Maryellen Pile [address] [place], [place]: [phone] F: [phone] [email or web address]    [facility]  [place] Therapist: [phone]     Eating Disorders   [facility]  [email or web address] [phone]  [facility] [phone], [phone]   Substance Abuse Treatment   [facility] [place], [place], [place], [place], [place], [place], [place], [place], [place], [place] Program:  [email or web address]    Managing Anxiety, Adult After being diagnosed with an anxiety disorder,  you may be relieved to know why you have felt or behaved a certain way. You may also feel overwhelmed about the treatment ahead and what it will mean for your life. With care and support, you can manage this condition and recover from it. How to manage lifestyle changes Managing stress and anxiety  Stress is your body's reaction to life changes and events, both good and bad. Most stress will last just a few hours, but stress can be ongoing and can lead to more than just stress. Although stress can play a major role in anxiety, it is not the same as anxiety. Stress is usually caused by something external, such as a deadline, test, or competition. Stress normally passes after the triggering event has ended.  Anxiety is caused by something internal,  such as imagining a terrible outcome or worrying that something will go wrong that will devastate you. Anxiety often does not go away even after the triggering event is over, and it can become long-term (chronic) worry. It is important to understand the differences between stress and anxiety and to manage your stress effectively so that it does not lead to an anxious response. Talk with your health care provider or a counselor to learn more about reducing anxiety and stress. He or she may suggest tension reduction techniques, such as:  Music therapy. This can include creating or listening to music that you enjoy and that inspires you.  Mindfulness-based meditation. This involves being aware of your normal breaths while not trying to control your breathing. It can be done while sitting or walking.  Centering prayer. This involves focusing on a word, phrase, or sacred image that means something to you and brings you peace.  Deep breathing. To do this, expand your stomach and inhale slowly through your nose. Hold your breath for 3-5 seconds. Then exhale slowly, letting your stomach muscles relax.  Self-talk. This involves identifying thought patterns that lead to anxiety reactions and changing those patterns.  Muscle relaxation. This involves tensing muscles and then relaxing them. Choose a tension reduction technique that suits your lifestyle and personality. These techniques take time and practice. Set aside 5-15 minutes a day to do them. Therapists can offer counseling and training in these techniques. The training to help with anxiety may be covered by some insurance plans. Other things you can do to manage stress and anxiety include:  Keeping a stress/anxiety diary. This can help you learn what triggers your reaction and then learn ways to manage your response.  Thinking about how you react to certain situations. You may not be able to control everything, but you can control your  response.  Making time for activities that help you relax and not feeling guilty about spending your time in this way.  Visual imagery and yoga can help you stay calm and relax.  Medicines Medicines can help ease symptoms. Medicines for anxiety include:  Anti-anxiety drugs.  Antidepressants. Medicines are often used as a primary treatment for anxiety disorder. Medicines will be prescribed by a health care provider. When used together, medicines, psychotherapy, and tension reduction techniques may be the most effective treatment. Relationships Relationships can play a big part in helping you recover. Try to spend more time connecting with trusted friends and family members. Consider going to couples counseling, taking family education classes, or going to family therapy. Therapy can help you and others better understand your condition. How to recognize changes in your anxiety Everyone responds differently to treatment for anxiety. Recovery  from anxiety happens when symptoms decrease and stop interfering with your daily activities at home or work. This may mean that you will start to:  Have better concentration and focus. Worry will interfere less in your daily thinking.  Sleep better.  Be less irritable.  Have more energy.  Have improved memory. It is important to recognize when your condition is getting worse. Contact your health care provider if your symptoms interfere with home or work and you feel like your condition is not improving. Follow these instructions at home: Activity  Exercise. Most adults should do the following: ? Exercise for at least 150 minutes each week. The exercise should increase your heart rate and make you sweat (moderate-intensity exercise). ? Strengthening exercises at least twice a week.  Get the right amount and quality of sleep. Most adults need 7-9 hours of sleep each night. Lifestyle   Eat a healthy diet that includes plenty of vegetables,  fruits, whole grains, low-fat dairy products, and lean protein. Do not eat a lot of foods that are high in solid fats, added sugars, or salt.  Make choices that simplify your life.  Do not use any products that contain nicotine or tobacco, such as cigarettes, e-cigarettes, and chewing tobacco. If you need help quitting, ask your health care provider.  Avoid caffeine, alcohol, and certain over-the-counter cold medicines. These may make you feel worse. Ask your pharmacist which medicines to avoid. General instructions  Take over-the-counter and prescription medicines only as told by your health care provider.  Keep all follow-up visits as told by your health care provider. This is important. Where to find support You can get help and support from these sources:  Self-help groups.  Online and [employer].  A trusted spiritual leader.  Couples counseling.  Family education classes.  Family therapy. Where to find more information You may find that joining a support group helps you deal with your anxiety. The following sources can help you locate counselors or support groups near you:  [place]: www.mentalhealthamerica.net  Anxiety and Depression Association of [place] (ADAA): https://www.clark.net/  National Alliance on Mental Illness (NAMI): www.nami.org Contact a health care provider if you:  Have a hard time staying focused or finishing daily tasks.  Spend many hours a day feeling worried about everyday life.  Become exhausted by worry.  Start to have headaches, feel tense, or have nausea.  Urinate more than normal.  Have diarrhea. Get help right away if you have:  A racing heart and shortness of breath.  Thoughts of hurting yourself or others. If you ever feel like you may hurt yourself or others, or have thoughts about taking your own life, get help right away. You can go to your nearest emergency department or call:  Your local emergency services  (911 in the [place]  A suicide crisis helpline, such as the [place] at 919-866-3346. This is open 24 hours a day. Summary  Taking steps to learn and use tension reduction techniques can help calm you and help prevent triggering an anxiety reaction.  When used together, medicines, psychotherapy, and tension reduction techniques may be the most effective treatment.  Family, friends, and partners can play a big part in helping you recover from an anxiety disorder. This information is not intended to replace advice given to you by your health care provider. Make sure you discuss any questions you have with your health care provider. Document Revised: 07/06/2018 Document Reviewed: 07/06/2018 Elsevier Patient Education  2020  [place] With Depression Everyone experiences occasional disappointment, sadness, and loss in their lives. When you are feeling down, blue, or sad for at least 2 weeks in a row, it may mean that you have depression. Depression can affect your thoughts and feelings, relationships, daily activities, and physical health. It is caused by changes in the way your brain functions. If you receive a diagnosis of depression, your health care provider will tell you which type of depression you have and what treatment options are available to you. If you are living with depression, there are ways to help you recover from it and also ways to prevent it from coming back. How to cope with lifestyle changes Coping with stress     Stress is your body's reaction to life changes and events, both good and bad. Stressful situations may include:  Getting married.  The death of a spouse.  Losing a job.  Retiring.  Having a baby. Stress can last just a few hours or it can be ongoing. Stress can play a major role in depression, so it is important to learn both how to cope with stress and how to think about it differently. Talk with your health  care provider or a counselor if you would like to learn more about stress reduction. He or she may suggest some stress reduction techniques, such as:  Music therapy. This can include creating music or listening to music. Choose music that you enjoy and that inspires you.  Mindfulness-based meditation. This kind of meditation can be done while sitting or walking. It involves being aware of your normal breaths, rather than trying to control your breathing.  Centering prayer. This is a kind of meditation that involves focusing on a spiritual word or phrase. Choose a word, phrase, or sacred image that is meaningful to you and that brings you peace.  Deep breathing. To do this, expand your stomach and inhale slowly through your nose. Hold your breath for 3-5 seconds, then exhale slowly, allowing your stomach muscles to relax.  Muscle relaxation. This involves intentionally tensing muscles then relaxing them. Choose a stress reduction technique that fits your lifestyle and personality. Stress reduction techniques take time and practice to develop. Set aside 5-15 minutes a day to do them. Therapists can offer training in these techniques. The training may be covered by some insurance plans. Other things you can do to manage stress include:  Keeping a stress diary. This can help you learn what triggers your stress and ways to control your response.  Understanding what your limits are and saying no to requests or events that lead to a schedule that is too full.  Thinking about how you respond to certain situations. You may not be able to control everything, but you can control how you react.  Adding humor to your life by watching funny films or TV shows.  Making time for activities that help you relax and not feeling guilty about spending your time this way.  Medicines Your health care provider may suggest certain medicines if he or she feels that they will help improve your condition. Avoid using  alcohol and other substances that may prevent your medicines from working properly (may interact). It is also important to:  Talk with your pharmacist or health care provider about all the medicines that you take, their possible side effects, and what medicines are safe to take together.  Make it your goal to take part in all treatment decisions (shared decision-making).  This includes giving input on the side effects of medicines. It is best if shared decision-making with your health care provider is part of your total treatment plan. If your health care provider prescribes a medicine, you may not notice the full benefits of it for 4-8 weeks. Most people who are treated for depression need to be on medicine for at least 6-12 months after they feel better. If you are taking medicines as part of your treatment, do not stop taking medicines without first talking to your health care provider. You may need to have the medicine slowly decreased (tapered) over time to decrease the risk of harmful side effects. Relationships Your health care provider may suggest family therapy along with individual therapy and drug therapy. While there may not be family problems that are causing you to feel depressed, it is still important to make sure your family learns as much as they can about your mental health. Having your family's support can help make your treatment successful. How to recognize changes in your condition Everyone has a different response to treatment for depression. Recovery from major depression happens when you have not had signs of major depression for two months. This may mean that you will start to:  Have more interest in doing activities.  Feel less hopeless than you did 2 months ago.  Have more energy.  Overeat less often, or have better or improving appetite.  Have better concentration. Your health care provider will work with you to decide the next steps in your recovery. It is also  important to recognize when your condition is getting worse. Watch for these signs:  Having fatigue or low energy.  Eating too much or too little.  Sleeping too much or too little.  Feeling restless, agitated, or hopeless.  Having trouble concentrating or making decisions.  Having unexplained physical complaints.  Feeling irritable, angry, or aggressive. Get help as soon as you or your family members notice these symptoms coming back. How to get support and help from others How to talk with friends and family members about your condition  Talking to friends and family members about your condition can provide you with one way to get support and guidance. Reach out to trusted friends or family members, explain your symptoms to them, and let them know that you are working with a health care provider to treat your depression. Financial resources Not all insurance plans cover mental health care, so it is important to check with your insurance carrier. If paying for co-pays or counseling services is a problem, search for a local or county mental health care center. They may be able to offer public mental health care services at low or no cost when you are not able to see a private health care provider. If you are taking medicine for depression, you may be able to get the generic form, which may be less expensive. Some makers of prescription medicines also offer help to patients who cannot afford the medicines they need. Follow these instructions at home:   Get the right amount and quality of sleep.  Cut down on using caffeine, tobacco, alcohol, and other potentially harmful substances.  Try to exercise, such as walking or lifting small weights.  Take over-the-counter and prescription medicines only as told by your health care provider.  Eat a healthy diet that includes plenty of vegetables, fruits, whole grains, low-fat dairy products, and lean protein. Do not eat a lot of foods that are  high in solid  fats, added sugars, or salt.  Keep all follow-up visits as told by your health care provider. This is important. Contact a health care provider if:  You stop taking your antidepressant medicines, and you have any of these symptoms: ? Nausea. ? Headache. ? Feeling lightheaded. ? Chills and body aches. ? Not being able to sleep (insomnia).  You or your friends and family think your depression is getting worse. Get help right away if:  You have thoughts of hurting yourself or others. If you ever feel like you may hurt yourself or others, or have thoughts about taking your own life, get help right away. You can go to your nearest emergency department or call:  Your local emergency services (911 in the [place]  A suicide crisis helpline, such as the [place] at 463-175-7201. This is open 24-hours a day. Summary  If you are living with depression, there are ways to help you recover from it and also ways to prevent it from coming back.  Work with your health care team to create a management plan that includes counseling, stress management techniques, and healthy lifestyle habits. This information is not intended to replace advice given to you by your health care provider. Make sure you discuss any questions you have with your health care provider. Document Revised: 05/28/2018 Document Reviewed: 01/07/2016 Elsevier Patient Education  [place].
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
## 2020-01-23 NOTE — Progress Notes (Signed)
Follow up from ED visit... 12/20/19

## 2020-01-31 ENCOUNTER — Telehealth: Payer: Self-pay

## 2020-01-31 NOTE — Telephone Encounter (Signed)
Patient started Lexapro last week and is having side effects. Inquiring if normal. Cb#[phone]

## 2020-01-31 NOTE — Telephone Encounter (Signed)
Spoke w/patient. She is experiencing nausea, dizziness, dry throat and eyes. Sometimes feels like hot water running thru her veins "like when you get contrast dye for MRI".

## 2020-02-13 ENCOUNTER — Ambulatory Visit: Payer: Self-pay | Admitting: Obstetrics and Gynecology

## 2020-07-31 ENCOUNTER — Other Ambulatory Visit: Payer: Self-pay

## 2020-07-31 ENCOUNTER — Emergency Department: Payer: Self-pay

## 2020-07-31 ENCOUNTER — Encounter: Payer: Self-pay | Admitting: Emergency Medicine

## 2020-07-31 ENCOUNTER — Emergency Department
Admission: EM | Admit: 2020-07-31 | Discharge: 2020-07-31 | Disposition: A | Discharge status: Home / Self Care | Payer: Self-pay | Attending: Emergency Medicine | Admitting: Emergency Medicine

## 2020-07-31 DIAGNOSIS — F17210 Nicotine dependence, cigarettes, uncomplicated: Secondary | ICD-10-CM | POA: Insufficient documentation

## 2020-07-31 DIAGNOSIS — W010XXA Fall on same level from slipping, tripping and stumbling without subsequent striking against object, initial encounter: Secondary | ICD-10-CM | POA: Insufficient documentation

## 2020-07-31 DIAGNOSIS — S060X0A Concussion without loss of consciousness, initial encounter: Secondary | ICD-10-CM | POA: Insufficient documentation

## 2020-07-31 DIAGNOSIS — Y92007 Garden or yard of unspecified non-institutional (private) residence as the place of occurrence of the external cause: Secondary | ICD-10-CM | POA: Insufficient documentation

## 2020-07-31 MED ORDER — ONDANSETRON 4 MG PO TBDP: 4.0000 mg | ORAL_TABLET | Freq: Four times a day (QID) | ORAL | 0 refills | Status: DC | PRN

## 2020-07-31 MED ORDER — KETOROLAC TROMETHAMINE 30 MG/ML IJ SOLN
15.0000 mg | Freq: Once | INTRAMUSCULAR | Status: AC
  Administered 2020-07-31: 15 mg via INTRAVENOUS
  Filled 2020-07-31: qty 1

## 2020-07-31 MED ORDER — SODIUM CHLORIDE 0.9 % IV BOLUS
1000.0000 mL | Freq: Once | INTRAVENOUS | Status: AC
  Administered 2020-07-31: 1000 mL via INTRAVENOUS

## 2020-07-31 MED ORDER — FENTANYL CITRATE (PF) 100 MCG/2ML IJ SOLN
50.0000 ug | Freq: Once | INTRAMUSCULAR | Status: AC
  Administered 2020-07-31: 50 ug via INTRAVENOUS
  Filled 2020-07-31: qty 2

## 2020-07-31 MED ORDER — ONDANSETRON HCL 4 MG/2ML IJ SOLN
4.0000 mg | INTRAMUSCULAR | Status: AC
  Administered 2020-07-31: 4 mg via INTRAVENOUS
  Filled 2020-07-31: qty 2
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
## 2020-07-31 NOTE — ED Notes (Signed)
ED Provider at bedside. 

## 2020-07-31 NOTE — ED Triage Notes (Signed)
In triage, pt c/o mechanical fall last night after tripping over her flip flop, pt with noted dry heaving in triage, no vomiting noted.

## 2020-07-31 NOTE — ED Triage Notes (Signed)
First RN Note: Pt to ED via [facility] with c/o witnessed fall last night in the yard. Per EMS pt c/o HA and N/V since the fall. Per EMS no LOC after fall, no blood thinners. Per EMS VSS. 4mg  Zofran given en route, refused Fentanyl by EMS.

## 2020-10-04 ENCOUNTER — Ambulatory Visit
Admission: EM | Admit: 2020-10-04 | Discharge: 2020-10-04 | Disposition: A | Discharge status: Home / Self Care | Payer: Self-pay | Attending: Family Medicine | Admitting: Family Medicine

## 2020-10-04 ENCOUNTER — Other Ambulatory Visit: Payer: Self-pay

## 2020-10-04 ENCOUNTER — Ambulatory Visit (INDEPENDENT_AMBULATORY_CARE_PROVIDER_SITE_OTHER): Payer: Self-pay

## 2020-10-04 DIAGNOSIS — J209 Acute bronchitis, unspecified: Secondary | ICD-10-CM

## 2020-10-04 DIAGNOSIS — R059 Cough, unspecified: Secondary | ICD-10-CM

## 2020-10-04 MED ORDER — DOXYCYCLINE HYCLATE 100 MG PO CAPS: 100.0000 mg | ORAL_CAPSULE | Freq: Two times a day (BID) | ORAL | 0 refills | Status: DC

## 2020-10-04 MED ORDER — PROMETHAZINE-DM 6.25-15 MG/5ML PO SYRP: 5.0000 mL | ORAL_SOLUTION | Freq: Four times a day (QID) | ORAL | 0 refills | Status: DC | PRN

## 2020-10-04 MED ORDER — PREDNISONE 50 MG PO TABS: ORAL_TABLET | ORAL | 0 refills | Status: DC

## 2020-10-04 NOTE — ED Triage Notes (Signed)
Pt here with C/O nagging Cough for over a month, night time is worst. Sometimes at night she coughs so long and hard that she can't catch her breath

## 2020-10-04 NOTE — Discharge Instructions (Signed)
Chest x-ray was clear.  There is no evidence of pneumonia.  Medication as prescribed.  Take care  Dr. Lacinda Axon
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
## 2020-12-24 ENCOUNTER — Other Ambulatory Visit: Payer: Self-pay

## 2020-12-24 ENCOUNTER — Ambulatory Visit
Admission: EM | Admit: 2020-12-24 | Discharge: 2020-12-24 | Disposition: A | Discharge status: Home / Self Care | Payer: Self-pay | Attending: Physician Assistant | Admitting: Physician Assistant

## 2020-12-24 ENCOUNTER — Encounter: Payer: Self-pay | Admitting: Licensed Clinical Social Worker

## 2020-12-24 DIAGNOSIS — H938X3 Other specified disorders of ear, bilateral: Secondary | ICD-10-CM | POA: Insufficient documentation

## 2020-12-24 DIAGNOSIS — J069 Acute upper respiratory infection, unspecified: Secondary | ICD-10-CM | POA: Insufficient documentation

## 2020-12-24 DIAGNOSIS — R051 Acute cough: Secondary | ICD-10-CM | POA: Insufficient documentation

## 2020-12-24 DIAGNOSIS — Z20822 Contact with and (suspected) exposure to covid-19: Secondary | ICD-10-CM | POA: Insufficient documentation

## 2020-12-24 DIAGNOSIS — R0981 Nasal congestion: Secondary | ICD-10-CM | POA: Insufficient documentation

## 2020-12-24 MED ORDER — PSEUDOEPH-BROMPHEN-DM 30-2-10 MG/5ML PO SYRP: 10.0000 mL | ORAL_SOLUTION | Freq: Four times a day (QID) | ORAL | 0 refills | Status: AC | PRN

## 2020-12-24 MED ORDER — IPRATROPIUM BROMIDE 0.06 % NA SOLN: 2.0000 | Freq: Four times a day (QID) | NASAL | 0 refills | Status: DC

## 2020-12-24 NOTE — Discharge Instructions (Addendum)
-  Symptoms consistent with a viral illness.  Supportive care encouraged.  Increase rest and fluids. - I have sent a cough medication decongestant to the pharmacy for you.  Use the GoodRx app to get this medication for about $10.  If you have trouble using the app, asked the pharmacist.  You can do the same thing with the nasal spray.  If the nasal spray is too much you can purchase over-the-counter Flonase and nasal saline. - Follow-up if you are not better in a week or if your symptoms worsen. - COVID test has been obtained.  If COVID-positive we will call you.  If your COVID test is positive you should be isolated 5 days and wear mask for 5 days.
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
## 2020-12-24 NOTE — ED Triage Notes (Signed)
Pt c/o of sinus pressure, facial pain, brownish color coming out of nose.x 2 days. Facial pain and pressure, ears are popping.

## 2020-12-25 LAB — SARS CORONAVIRUS 2 (TAT 6-24 HRS): SARS Coronavirus 2: NEGATIVE

## 2020-12-31 ENCOUNTER — Other Ambulatory Visit: Payer: Self-pay

## 2020-12-31 ENCOUNTER — Emergency Department
Admission: EM | Admit: 2020-12-31 | Discharge: 2020-12-31 | Disposition: A | Discharge status: Home / Self Care | Payer: Self-pay | Attending: Emergency Medicine | Admitting: Emergency Medicine

## 2020-12-31 ENCOUNTER — Encounter: Payer: Self-pay | Admitting: Emergency Medicine

## 2020-12-31 DIAGNOSIS — J0110 Acute frontal sinusitis, unspecified: Secondary | ICD-10-CM | POA: Insufficient documentation

## 2020-12-31 DIAGNOSIS — F17210 Nicotine dependence, cigarettes, uncomplicated: Secondary | ICD-10-CM | POA: Insufficient documentation

## 2020-12-31 MED ORDER — AMOXICILLIN-POT CLAVULANATE 875-125 MG PO TABS: 1.0000 | ORAL_TABLET | Freq: Two times a day (BID) | ORAL | 0 refills | Status: AC

## 2020-12-31 MED ORDER — AMOXICILLIN-POT CLAVULANATE 875-125 MG PO TABS: 1.0000 | ORAL_TABLET | Freq: Two times a day (BID) | ORAL | 0 refills | Status: DC

## 2020-12-31 MED ORDER — DEXAMETHASONE 10 MG/ML FOR PEDIATRIC ORAL USE
10.0000 mg | Freq: Once | INTRAMUSCULAR | Status: AC
  Administered 2020-12-31: 10 mg via ORAL
  Filled 2020-12-31: qty 1

## 2020-12-31 NOTE — ED Triage Notes (Signed)
C/O sinus congestion, facial pain and pressure x 1 week. States seen through Urgent Care for same last week, prescribed decongestants, symptoms persists.  AAOx3.  Skin warm and dry. NAD
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
## 2020-12-31 NOTE — Discharge Instructions (Signed)
Take Augmentin twice daily for ten days.  

## 2021-01-03 ENCOUNTER — Emergency Department: Payer: Self-pay

## 2021-01-03 ENCOUNTER — Emergency Department
Admission: EM | Admit: 2021-01-03 | Discharge: 2021-01-03 | Disposition: A | Discharge status: Home / Self Care | Payer: Self-pay | Attending: Emergency Medicine | Admitting: Emergency Medicine

## 2021-01-03 ENCOUNTER — Other Ambulatory Visit: Payer: Self-pay

## 2021-01-03 DIAGNOSIS — R0602 Shortness of breath: Secondary | ICD-10-CM | POA: Insufficient documentation

## 2021-01-03 DIAGNOSIS — T782XXA Anaphylactic shock, unspecified, initial encounter: Secondary | ICD-10-CM

## 2021-01-03 DIAGNOSIS — J0110 Acute frontal sinusitis, unspecified: Secondary | ICD-10-CM

## 2021-01-03 DIAGNOSIS — R051 Acute cough: Secondary | ICD-10-CM | POA: Insufficient documentation

## 2021-01-03 LAB — BLOOD GAS, VENOUS
Acid-base deficit: 1.5 mmol/L (ref 0.0–2.0)
Bicarbonate: 18.4 mmol/L — ABNORMAL LOW (ref 20.0–28.0)
O2 Saturation: 99.6 %
Patient temperature: 37
pCO2, Ven: 21 mmHg — ABNORMAL LOW (ref 44.0–60.0)
pH, Ven: 7.55 — ABNORMAL HIGH (ref 7.250–7.430)
pO2, Ven: 164 mmHg — ABNORMAL HIGH (ref 32.0–45.0)

## 2021-01-03 LAB — URINALYSIS, ROUTINE W REFLEX MICROSCOPIC
Bilirubin Urine: NEGATIVE
Glucose, UA: NEGATIVE mg/dL
Hgb urine dipstick: NEGATIVE
Ketones, ur: 20 mg/dL — AB
Leukocytes,Ua: NEGATIVE
Nitrite: NEGATIVE
Protein, ur: NEGATIVE mg/dL
Specific Gravity, Urine: 1.021 (ref 1.005–1.030)
pH: 7 (ref 5.0–8.0)

## 2021-01-03 LAB — PROCALCITONIN: Procalcitonin: <0.1 ng/mL

## 2021-01-03 LAB — COMPREHENSIVE METABOLIC PANEL
ALT: 15 U/L (ref 0–44)
AST: 23 U/L (ref 15–41)
Albumin: 4.1 g/dL (ref 3.5–5.0)
Alkaline Phosphatase: 57 U/L (ref 38–126)
Anion gap: 10 (ref 5–15)
BUN: 11 mg/dL (ref 6–20)
CO2: 18 mmol/L — ABNORMAL LOW (ref 22–32)
Calcium: 9.2 mg/dL (ref 8.9–10.3)
Chloride: 107 mmol/L (ref 98–111)
Creatinine, Ser: 0.82 mg/dL (ref 0.44–1.00)
GFR, Estimated: >60 mL/min (ref >60)
Glucose, Bld: 119 mg/dL — ABNORMAL HIGH (ref 70–99)
Potassium: 3.6 mmol/L (ref 3.5–5.1)
Sodium: 135 mmol/L (ref 135–145)
Total Bilirubin: 1.1 mg/dL (ref 0.3–1.2)
Total Protein: 8 g/dL (ref 6.5–8.1)

## 2021-01-03 LAB — CBC WITH DIFFERENTIAL/PLATELET
Abs Immature Granulocytes: 0.08 10*3/uL — ABNORMAL HIGH (ref 0.00–0.07)
Basophils Absolute: 0.1 10*3/uL (ref 0.0–0.1)
Basophils Relative: 1 %
Eosinophils Absolute: 0.9 10*3/uL — ABNORMAL HIGH (ref 0.0–0.5)
Eosinophils Relative: 5 %
HCT: 43.1 % (ref 36.0–46.0)
Hemoglobin: 14.5 g/dL (ref 12.0–15.0)
Immature Granulocytes: 0 %
Lymphocytes Relative: 16 %
Lymphs Abs: 2.8 10*3/uL (ref 0.7–4.0)
MCH: 30.2 pg (ref 26.0–34.0)
MCHC: 33.6 g/dL (ref 30.0–36.0)
MCV: 89.8 fL (ref 80.0–100.0)
Monocytes Absolute: 1.2 10*3/uL — ABNORMAL HIGH (ref 0.1–1.0)
Monocytes Relative: 7 %
Neutro Abs: 12.8 10*3/uL — ABNORMAL HIGH (ref 1.7–7.7)
Neutrophils Relative %: 71 %
Platelets: 388 10*3/uL (ref 150–400)
RBC: 4.8 MIL/uL (ref 3.87–5.11)
RDW: 11.9 % (ref 11.5–15.5)
WBC: 17.9 10*3/uL — ABNORMAL HIGH (ref 4.0–10.5)
nRBC: 0 % (ref 0.0–0.2)

## 2021-01-03 LAB — PREGNANCY, URINE: Preg Test, Ur: NEGATIVE

## 2021-01-03 MED ORDER — ALBUTEROL SULFATE HFA 108 (90 BASE) MCG/ACT IN AERS: 2.0000 | INHALATION_SPRAY | Freq: Four times a day (QID) | RESPIRATORY_TRACT | 2 refills | Status: DC | PRN

## 2021-01-03 MED ORDER — EPINEPHRINE 0.3 MG/0.3ML IJ SOAJ: 0.3000 mg | INTRAMUSCULAR | 0 refills | Status: DC | PRN

## 2021-01-03 MED ORDER — METHYLPREDNISOLONE SODIUM SUCC 125 MG IJ SOLR
125.0000 mg | Freq: Once | INTRAMUSCULAR | Status: AC
  Administered 2021-01-03: 125 mg via INTRAVENOUS
  Filled 2021-01-03: qty 2

## 2021-01-03 MED ORDER — DIPHENHYDRAMINE HCL 50 MG/ML IJ SOLN
50.0000 mg | Freq: Once | INTRAMUSCULAR | Status: DC
  Filled 2021-01-03: qty 1

## 2021-01-03 MED ORDER — LORAZEPAM 2 MG/ML IJ SOLN
1.0000 mg | Freq: Once | INTRAMUSCULAR | Status: AC
  Administered 2021-01-03: 1 mg via INTRAVENOUS
  Filled 2021-01-03: qty 1

## 2021-01-03 MED ORDER — DOXYCYCLINE HYCLATE 100 MG PO CAPS: 100.0000 mg | ORAL_CAPSULE | Freq: Two times a day (BID) | ORAL | 0 refills | Status: AC

## 2021-01-03 MED ORDER — EPINEPHRINE 0.3 MG/0.3ML IJ SOAJ
0.3000 mg | Freq: Once | INTRAMUSCULAR | Status: AC
  Administered 2021-01-03: 0.3 mg via INTRAMUSCULAR
  Filled 2021-01-03: qty 0.3

## 2021-01-03 MED ORDER — FAMOTIDINE IN NACL 20-0.9 MG/50ML-% IV SOLN
20.0000 mg | Freq: Once | INTRAVENOUS | Status: AC
  Administered 2021-01-03: 20 mg via INTRAVENOUS
  Filled 2021-01-03: qty 50

## 2021-01-03 NOTE — ED Notes (Signed)
Pt ambulated to the restroom with the day shift RN with standby assist - VSS.

## 2021-01-03 NOTE — ED Notes (Signed)
Pt states "I can't take benadryl, it makes my heart race". Per Dr Cinda Quest, give other meds first then see if pt tolerates slow IV push.

## 2021-01-03 NOTE — ED Provider Notes (Signed)
[facility] [place] Emergency Department Provider Note   ____________________________________________   Event Date/Time   First MD Initiated Contact with Patient 01/03/21 1345     (approximate)  I have reviewed the triage vital signs and the nursing notes.   HISTORY  Chief Complaint Shortness of Breath (Wheezing and stridor/Severe sinus infx)   HPI Vickie Gomez is a 35 y.o. female who was reportedly seen in the emergency department a couple days ago and got amoxicillin for a bad sinus infection.  There are no previous records in chart review for this however.  At any rate after the second dose of amoxicillin last night she began getting short of breath and having itching in her throat and feeling her "throat closing.  EMS was called this morning when she was not getting any better and heard stridor and wheezing.       History reviewed. No pertinent past medical history. Only multiple allergies.    Prior to Admission medications   Not on File    Allergies Augmentin [amoxicillin-pot clavulanate]  History reviewed. No pertinent family history.  Social History    Review of Systems  Constitutional: No fever/chills Eyes: No visual changes. ENT: Itchy sore throat. Cardiovascular: Denies chest pain. Respiratory:  shortness of breath. Gastrointestinal: No abdominal pain.  No nausea, no vomiting.  No diarrhea.  No constipation. Genitourinary: Negative for dysuria. Musculoskeletal: Negative for back pain. Skin: Negative for rash. Neurological: Negative for headaches, focal weakness   ____________________________________________   PHYSICAL EXAM:  VITAL SIGNS: ED Triage Vitals  Enc Vitals Group     BP 01/03/21 1348 136/77     Pulse Rate 01/03/21 1348 (!) 104     Resp 01/03/21 1348 (!) 28     Temp --      Temp src --      SpO2 01/03/21 1347 99 %     Weight 01/03/21 1349 165 lb (74.8 kg)     Height 01/03/21 1349 5\' 3"  (1.6 m)     Head  Circumference --      Peak Flow --      Pain Score 01/03/21 1355 10     Pain Loc --      Pain Edu? --      Excl. in [place]? --     Constitutional: Alert and oriented.  Very anxious and upset coughing and crying Eyes: Conjunctivae are normal. PERRL. EOMI. Head: Atraumatic. Nose: congestion/rhinnorhea. Mouth/Throat: Mucous membranes are moist.  Oropharynx non-erythematous. Neck: Some stridor. Cardiovascular: Rapid rate, regular rhythm. Grossly normal heart sounds.  Good peripheral circulation. Respiratory: Increased respiratory effort.   retractions. Lungs referred sounds from upper airway Gastrointestinal: Soft and nontender. No distention. No abdominal bruits. Musculoskeletal: No lower extremity tenderness nor edema.   Neurologic:  Normal speech and language. No gross focal neurologic deficits are appreciated.  Skin:  Skin is warm, dry and intact. No rash noted.   ____________________________________________   LABS (all labs ordered are listed, but only abnormal results are displayed)  Labs Reviewed  COMPREHENSIVE METABOLIC PANEL - Abnormal; Notable for the following components:      Result Value   CO2 18 (*)    Glucose, Bld 119 (*)    All other components within normal limits  CBC WITH DIFFERENTIAL/PLATELET - Abnormal; Notable for the following components:   WBC 17.9 (*)    Neutro Abs 12.8 (*)    Monocytes Absolute 1.2 (*)    Eosinophils Absolute 0.9 (*)    Abs Immature Granulocytes  0.08 (*)    All other components within normal limits  BLOOD GAS, VENOUS - Abnormal; Notable for the following components:   pH, Ven 7.55 (*)    pCO2, Ven 21 (*)    pO2, Ven 164.0 (*)    Bicarbonate 18.4 (*)    All other components within normal limits  PREGNANCY, URINE  URINALYSIS, ROUTINE W REFLEX MICROSCOPIC  PROCALCITONIN   ____________________________________________  EKG   ____________________________________________  RADIOLOGY Gertha Calkin, personally viewed and  evaluated these images (plain radiographs) as part of my medical decision making, as well as reviewing the written report by the radiologist.  ED MD interpretation: X-rays of the chest and soft tissue of the neck read by radiology as negative I reviewed the films and agree  Official radiology report(s): DG Neck Soft Tissue  Result Date: 01/03/2021 CLINICAL DATA:  Shortness of breath, stridor EXAM: NECK SOFT TISSUES - 1+ VIEW COMPARISON:  None. FINDINGS: There is no evidence of retropharyngeal soft tissue swelling or epiglottic enlargement. The cervical airway is unremarkable and no radio-opaque foreign body identified. IMPRESSION: No radiographic abnormality of the cervical soft tissues. Electronically Signed   By: Delanna Ahmadi M.D.   On: 01/03/2021 14:48   DG Chest Portable 1 View  Result Date: 01/03/2021 CLINICAL DATA:  Shortness of breath, stridor EXAM: PORTABLE CHEST 1 VIEW COMPARISON:  10/04/2020 FINDINGS: The heart size and mediastinal contours are within normal limits. Both lungs are clear. The visualized skeletal structures are unremarkable. IMPRESSION: No acute abnormality of the lungs in AP portable projection. Electronically Signed   By: Delanna Ahmadi M.D.   On: 01/03/2021 14:48    ____________________________________________   PROCEDURES  Procedure(s) performed (including Critical Care):  Procedures   ____________________________________________   INITIAL IMPRESSION / ASSESSMENT AND PLAN / ED COURSE ----------------------------------------- 4:10 PM on 01/03/2021 ----------------------------------------- Patient awake and resting after the Ativan.  No stridor lungs are clear patient doing well she does have a bit of a runny nose.  Her white count is elevated which she has been using steroids.  This may explain the elevated white count.  I will get a procalcitonin to help elucidate the need for any antibiotics.  I will consider Zithromax.  I want to sign this patient out to  oncoming provider.              ____________________________________________   FINAL CLINICAL IMPRESSION(S) / ED DIAGNOSES  Final diagnoses:  Acute cough     ED Discharge Orders     None        Note:  This document was prepared using Dragon voice recognition software and may include unintentional dictation errors.    Nena Polio, MD 01/03/21 [phone]

## 2021-01-03 NOTE — ED Provider Notes (Signed)
I assumed care of this patient approximately 1500.  Please see ongoing providers note for full details regarding patient's initial evaluation and assessment.  In brief patient presents with some scratchiness in her throat shortness of breath itchiness with concerns for anaphylaxis from recently initiated Augmentin which was prescribed 2 days ago for sinusitis.  States he has had some frontal facial pain congestion and cough for couple weeks.  She was given epinephrine as well as some fluids.  Chest x-ray and neck x-ray unremarkable.  Plan is to follow-up procalcitonin is CBC slight elevated which may be from some recently prescribed steroids.  In addition I will quantified her her x-ray of the sinuses to assess for ongoing sinus disease.  Procalcitonin is undetectable but sinus plain films do show ongoing sinus disease.  Patient is feeling much better on my reassessment with no stridor, wheezing, or other abnormal breath sounds or evidence of respiratory distress.  She was observed for several hours after receiving epinephrine and given she has not required any doses and is feeling much better on my reassessment with otherwise reassuring exam I think she is stable for discharge outpatient follow-up.  Will write Rx for EpiPen and change her antibiotic to doxycycline.  Discharged in stable condition.  Strict return precautions advised and discussed.   Lucrezia Starch, MD 01/03/21 [phone]

## 2021-01-03 NOTE — ED Notes (Signed)
Pt back from x-ray.

## 2021-01-03 NOTE — ED Triage Notes (Signed)
Husband at bedside. Pt started having respiratory distress and wheezing last night around 10pm.  This started after second dose of amox which was prescribed for sinus infx.

## 2021-01-03 NOTE — ED Triage Notes (Signed)
from home 35 y/o female [facility] breathing difficulty 14th nov diagnosed severe sinus infx and has been on abx  Severe wheezing and stridor noted by EMS  EMS gave racemic epi 1mg :1000 1 albuterol, 1 duoneb, 4mg  zofran given by EMS  Pt coughing strongly,  Dr Cinda Quest at bedside EMS vs: 119/79, HR 89, 99% on 2L oxygen, CBG 121. Now on RA

## 2021-01-03 NOTE — ED Notes (Signed)
Pt in xray

## 2021-01-04 ENCOUNTER — Encounter: Payer: Self-pay | Admitting: Emergency Medicine

## 2021-02-04 ENCOUNTER — Observation Stay: Payer: Self-pay

## 2021-02-04 ENCOUNTER — Emergency Department: Payer: Self-pay

## 2021-02-04 ENCOUNTER — Other Ambulatory Visit: Payer: Self-pay

## 2021-02-04 ENCOUNTER — Ambulatory Visit
Admission: EM | Admit: 2021-02-04 | Discharge: 2021-02-04 | Disposition: A | Discharge status: Home / Self Care | Payer: Self-pay

## 2021-02-04 ENCOUNTER — Inpatient Hospital Stay
Admission: EM | Admit: 2021-02-04 | Discharge: 2021-02-06 | DRG: 072 | Disposition: A | Discharge status: Home / Self Care | Payer: Self-pay | Attending: Internal Medicine | Admitting: Internal Medicine

## 2021-02-04 ENCOUNTER — Encounter: Payer: Self-pay | Admitting: Emergency Medicine

## 2021-02-04 DIAGNOSIS — Z885 Allergy status to narcotic agent status: Secondary | ICD-10-CM

## 2021-02-04 DIAGNOSIS — D72829 Elevated white blood cell count, unspecified: Secondary | ICD-10-CM | POA: Diagnosis present

## 2021-02-04 DIAGNOSIS — Z881 Allergy status to other antibiotic agents status: Secondary | ICD-10-CM

## 2021-02-04 DIAGNOSIS — Z20822 Contact with and (suspected) exposure to covid-19: Secondary | ICD-10-CM | POA: Diagnosis present

## 2021-02-04 DIAGNOSIS — Z79899 Other long term (current) drug therapy: Secondary | ICD-10-CM

## 2021-02-04 DIAGNOSIS — R509 Fever, unspecified: Secondary | ICD-10-CM

## 2021-02-04 DIAGNOSIS — M542 Cervicalgia: Secondary | ICD-10-CM | POA: Diagnosis present

## 2021-02-04 DIAGNOSIS — G9341 Metabolic encephalopathy: Principal | ICD-10-CM

## 2021-02-04 DIAGNOSIS — R531 Weakness: Secondary | ICD-10-CM

## 2021-02-04 DIAGNOSIS — G9340 Encephalopathy, unspecified: Secondary | ICD-10-CM | POA: Diagnosis present

## 2021-02-04 DIAGNOSIS — F17210 Nicotine dependence, cigarettes, uncomplicated: Secondary | ICD-10-CM | POA: Diagnosis present

## 2021-02-04 DIAGNOSIS — G039 Meningitis, unspecified: Secondary | ICD-10-CM | POA: Diagnosis present

## 2021-02-04 DIAGNOSIS — Z716 Tobacco abuse counseling: Secondary | ICD-10-CM

## 2021-02-04 DIAGNOSIS — S0990XA Unspecified injury of head, initial encounter: Secondary | ICD-10-CM

## 2021-02-04 DIAGNOSIS — Z28310 Unvaccinated for covid-19: Secondary | ICD-10-CM

## 2021-02-04 DIAGNOSIS — Z888 Allergy status to other drugs, medicaments and biological substances status: Secondary | ICD-10-CM

## 2021-02-04 DIAGNOSIS — Z720 Tobacco use: Secondary | ICD-10-CM | POA: Diagnosis present

## 2021-02-04 DIAGNOSIS — Z880 Allergy status to penicillin: Secondary | ICD-10-CM

## 2021-02-04 DIAGNOSIS — F419 Anxiety disorder, unspecified: Secondary | ICD-10-CM | POA: Diagnosis present

## 2021-02-04 DIAGNOSIS — R519 Headache, unspecified: Secondary | ICD-10-CM

## 2021-02-04 DIAGNOSIS — Z87820 Personal history of traumatic brain injury: Secondary | ICD-10-CM

## 2021-02-04 DIAGNOSIS — Z8249 Family history of ischemic heart disease and other diseases of the circulatory system: Secondary | ICD-10-CM

## 2021-02-04 LAB — CBC WITH DIFFERENTIAL/PLATELET
Abs Immature Granulocytes: 0.04 10*3/uL (ref 0.00–0.07)
Basophils Absolute: 0.1 10*3/uL (ref 0.0–0.1)
Basophils Relative: 1 %
Eosinophils Absolute: 0.7 10*3/uL — ABNORMAL HIGH (ref 0.0–0.5)
Eosinophils Relative: 5 %
HCT: 42 % (ref 36.0–46.0)
Hemoglobin: 14 g/dL (ref 12.0–15.0)
Immature Granulocytes: 0 %
Lymphocytes Relative: 25 %
Lymphs Abs: 3.2 10*3/uL (ref 0.7–4.0)
MCH: 30.8 pg (ref 26.0–34.0)
MCHC: 33.3 g/dL (ref 30.0–36.0)
MCV: 92.3 fL (ref 80.0–100.0)
Monocytes Absolute: 1 10*3/uL (ref 0.1–1.0)
Monocytes Relative: 8 %
Neutro Abs: 7.8 10*3/uL — ABNORMAL HIGH (ref 1.7–7.7)
Neutrophils Relative %: 61 %
Platelets: 352 10*3/uL (ref 150–400)
RBC: 4.55 MIL/uL (ref 3.87–5.11)
RDW: 12.1 % (ref 11.5–15.5)
WBC: 12.9 10*3/uL — ABNORMAL HIGH (ref 4.0–10.5)
nRBC: 0 % (ref 0.0–0.2)

## 2021-02-04 LAB — BASIC METABOLIC PANEL
Anion gap: 6 (ref 5–15)
BUN: 10 mg/dL (ref 6–20)
CO2: 25 mmol/L (ref 22–32)
Calcium: 9.3 mg/dL (ref 8.9–10.3)
Chloride: 105 mmol/L (ref 98–111)
Creatinine, Ser: 0.77 mg/dL (ref 0.44–1.00)
GFR, Estimated: >60 mL/min (ref >60)
Glucose, Bld: 107 mg/dL — ABNORMAL HIGH (ref 70–99)
Potassium: 3.7 mmol/L (ref 3.5–5.1)
Sodium: 136 mmol/L (ref 135–145)

## 2021-02-04 LAB — PROTEIN AND GLUCOSE, CSF
Glucose, CSF: 67 mg/dL (ref 40–70)
Total  Protein, CSF: 51 mg/dL — ABNORMAL HIGH (ref 15–45)

## 2021-02-04 LAB — PHOSPHORUS: Phosphorus: 4.4 mg/dL (ref 2.5–4.6)

## 2021-02-04 LAB — HCG, QUANTITATIVE, PREGNANCY: hCG, Beta Chain, Quant, S: <1 m[IU]/mL (ref <5)

## 2021-02-04 LAB — PROCALCITONIN: Procalcitonin: <0.1 ng/mL

## 2021-02-04 LAB — MAGNESIUM: Magnesium: 2.3 mg/dL (ref 1.7–2.4)

## 2021-02-04 MED ORDER — ONDANSETRON HCL 4 MG PO TABS: 4.0000 mg | ORAL_TABLET | Freq: Four times a day (QID) | ORAL | Status: DC | PRN

## 2021-02-04 MED ORDER — LACTATED RINGERS IV SOLN: INTRAVENOUS | Status: DC

## 2021-02-04 MED ORDER — KETOROLAC TROMETHAMINE 30 MG/ML IJ SOLN
30.0000 mg | Freq: Once | INTRAMUSCULAR | Status: AC
  Administered 2021-02-04: 17:00:00 30 mg via INTRAVENOUS
  Filled 2021-02-04: qty 1

## 2021-02-04 MED ORDER — FENTANYL CITRATE PF 50 MCG/ML IJ SOSY: 50.0000 ug | PREFILLED_SYRINGE | Freq: Once | INTRAMUSCULAR | Status: DC

## 2021-02-04 MED ORDER — SODIUM CHLORIDE 0.9 % IV SOLN
2.0000 g | Freq: Once | INTRAVENOUS | Status: DC
  Administered 2021-02-05: 01:00:00 2 g via INTRAVENOUS
  Filled 2021-02-04: qty 1

## 2021-02-04 MED ORDER — ACETAMINOPHEN 650 MG RE SUPP: 650.0000 mg | Freq: Four times a day (QID) | RECTAL | Status: DC | PRN

## 2021-02-04 MED ORDER — DEXAMETHASONE SODIUM PHOSPHATE 10 MG/ML IJ SOLN
10.0000 mg | Freq: Once | INTRAMUSCULAR | Status: AC
  Administered 2021-02-04: 23:00:00 10 mg via INTRAVENOUS
  Filled 2021-02-04: qty 1

## 2021-02-04 MED ORDER — DEXTROSE 5 % IV SOLN
10.0000 mg/kg | Freq: Once | INTRAVENOUS | Status: AC
  Administered 2021-02-05: 01:00:00 750 mg via INTRAVENOUS
  Filled 2021-02-04: qty 15

## 2021-02-04 MED ORDER — VANCOMYCIN HCL 1750 MG/350ML IV SOLN
1750.0000 mg | Freq: Once | INTRAVENOUS | Status: AC
  Administered 2021-02-05: 02:00:00 1750 mg via INTRAVENOUS
  Filled 2021-02-04: qty 350

## 2021-02-04 MED ORDER — ACETAMINOPHEN 500 MG PO TABS
1000.0000 mg | ORAL_TABLET | Freq: Four times a day (QID) | ORAL | Status: DC | PRN
  Administered 2021-02-05: 18:00:00 1000 mg via ORAL

## 2021-02-04 MED ORDER — LIDOCAINE 5 % EX PTCH
1.0000 | MEDICATED_PATCH | CUTANEOUS | Status: DC
  Administered 2021-02-04 – 2021-02-05 (×2): 1 via TRANSDERMAL
  Filled 2021-02-04 (×4): qty 1

## 2021-02-04 MED ORDER — LORAZEPAM 2 MG/ML IJ SOLN
1.0000 mg | Freq: Once | INTRAMUSCULAR | Status: AC
  Administered 2021-02-04: 18:00:00 1 mg via INTRAVENOUS
  Filled 2021-02-04: qty 1

## 2021-02-04 MED ORDER — ONDANSETRON HCL 4 MG/2ML IJ SOLN
4.0000 mg | Freq: Four times a day (QID) | INTRAMUSCULAR | Status: DC | PRN
  Administered 2021-02-05: 07:00:00 4 mg via INTRAVENOUS
  Filled 2021-02-04: qty 2

## 2021-02-04 MED ORDER — SODIUM CHLORIDE 0.9 % IV SOLN: 2.0000 g | Freq: Once | INTRAVENOUS | Status: DC

## 2021-02-04 MED ORDER — PROCHLORPERAZINE EDISYLATE 10 MG/2ML IJ SOLN
10.0000 mg | Freq: Once | INTRAMUSCULAR | Status: AC
  Administered 2021-02-04: 22:00:00 10 mg via INTRAVENOUS
  Filled 2021-02-04: qty 2

## 2021-02-04 MED ORDER — FENTANYL CITRATE PF 50 MCG/ML IJ SOSY
12.5000 ug | PREFILLED_SYRINGE | INTRAMUSCULAR | Status: AC | PRN
  Administered 2021-02-05 (×2): 12.5 ug via INTRAVENOUS
  Filled 2021-02-04 (×2): qty 1

## 2021-02-04 MED ORDER — LACTATED RINGERS IV BOLUS
1000.0000 mL | Freq: Once | INTRAVENOUS | Status: AC
  Administered 2021-02-05: 01:00:00 1000 mL via INTRAVENOUS

## 2021-02-04 MED ORDER — GADOBUTROL 1 MMOL/ML IV SOLN
7.0000 mL | Freq: Once | INTRAVENOUS | Status: AC | PRN
  Administered 2021-02-04: 19:00:00 7 mL via INTRAVENOUS
  Filled 2021-02-04: qty 7.5

## 2021-02-04 NOTE — Progress Notes (Signed)
PHARMACY -  BRIEF ANTIBIOTIC NOTE   Pharmacy has received consult(s) for vancomycin, meropenem, acyclovir from an ED provider.  The patient's profile has been reviewed for ht/wt/allergies/indication/available labs.    One time order(s) placed for: Vancomycin 1750 mg Meropenem 2 g Acyclovir 750 mg  Further antibiotics/pharmacy consults should be ordered by admitting physician if indicated.                       Thank you, Forde Dandy Murline Weigel 02/04/2021  10:30 PM

## 2021-02-04 NOTE — Progress Notes (Signed)
Pharmacy Antibiotic Note  Vickie Gomez is a 35 y.o. female admitted on 02/04/2021 with Meningitis.  Pharmacy has been consulted for vancomycin and acyclovir dosing.  Plan: Vancomycin 1750 mg IV x 1 loading dose followed by 750 mg IV q8h. Goal trough 15 - 20 mcg/ml Acyclovir 10 mg/kg (750 mg) IV q8h. LR @125  ml/hr Monitor renal function and adjust dose as clinically indicated  Height: 5\' 4"  (162.6 cm) Weight: 75 kg (165 lb 5.5 oz) IBW/kg (Calculated) : 54.7  Temp (24hrs), Avg:98.1 F (36.7 C), Min:97.8 F (36.6 C), Max:98.4 F (36.9 C)  Recent Labs  Lab 02/04/21 1657  WBC 12.9*  CREATININE 0.77    Estimated Creatinine Clearance: 99.2 mL/min (by C-G formula based on SCr of 0.77 mg/dL).    Allergies  Allergen Reactions   Augmentin [Amoxicillin-Pot Clavulanate] Shortness Of Breath    Wheezing, stridor, anxiety   Ambien [Zolpidem Tartrate]     Chest Pain    Codeine Palpitations   Diphenhydramine Palpitations   Lorazepam Palpitations    Antimicrobials this admission: 12/19 meropenem x 1 12/19 vancomycin >>  12/19 acyclovir >>  12/19 ceftriaxone >>    Thank you for allowing pharmacy to be a part of this patients care.  Forde Dandy Mckenzey Parcell 02/04/2021 10:58 PM

## 2021-02-04 NOTE — ED Notes (Signed)
Pt from home with pain since Saturday. Pt states she has neck and head pain, some sinus symptoms as well. Endorses low grade fever, thinks it is caused by anxiety.

## 2021-02-04 NOTE — ED Provider Notes (Addendum)
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  LORazepam (ATIVAN) injection 1 mg (1 mg Intravenous Given 02/04/21 1755)  gadobutrol (GADAVIST) 1 MMOL/ML injection 7 mL (7 mLs Intravenous Contrast Given 02/04/21 1902)  prochlorperazine (COMPAZINE) injection 10 mg (10 mg Intravenous Given 02/04/21 2154)     ED Discharge Orders     None        Note:  This document was prepared using Dragon voice recognition software and may include unintentional dictation errors.    Lucrezia Starch, MD 02/04/21 2213    Lucrezia Starch, MD 02/04/21 2214

## 2021-02-04 NOTE — ED Notes (Signed)
Consent was signed by Vickie Gomez for dr z Tamala Julian to perform LP

## 2021-02-04 NOTE — ED Notes (Signed)
LP done  pt tolerated well.  Specimen to lab.

## 2021-02-04 NOTE — ED Triage Notes (Signed)
Pt c/o neck pain, dizziness, headache. She states she was hanging christmas lights and lifted her head on a wooden table. She states her neck is tender to the touch in the area the pain is. She states there is a bruise in the area on the neck also.

## 2021-02-04 NOTE — ED Triage Notes (Signed)
Pt to ED for neck pain after hitting head while hanging christmas lights on Saturday.  Roderic Palau MSE in triage

## 2021-02-04 NOTE — ED Provider Notes (Signed)
Emergency Medicine Provider Triage Evaluation Note  Vickie Gomez , a 35 y.o. female  was evaluated in triage.  Pt complains of headache and neck pain.  Patient hit her head while standing up 2 days ago, has had worsening headache, left-sided neck pain.  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  No radicular symptoms.  No loss of consciousness.  Review of Systems  Positive: Headache and left-sided neck pain Negative: Loss of consciousness, visual changes, radicular symptoms in the upper or lower extremities  Physical Exam  BP 123/84 (BP Location: Left Arm)    Pulse 85    Temp 98.4 F (36.9 C) (Oral)    Resp 18    Ht 5\' 4"  (1.626 m)    Wt 75 kg    LMP 01/18/2021    SpO2 97%    BMI 28.38 kg/m  Gen:   Awake, no distress   Resp:  Normal effort  MSK:   Tender over C5, C6 and left paraspinal muscle group left cervical spine. Other:  Cranial nerves intact  Medical Decision Making  Medically screening exam initiated at 3:54 PM.  Appropriate orders placed.  Latia Ciano was informed that the remainder of the evaluation will be completed by another provider, this initial triage assessment does not replace that evaluation, and the importance of remaining in the ED until their evaluation is complete.  Patient hit her head, having worsening headache and neck pain, torticollis at this time.  Patient will have imaging as she has a history of a "slipped disc" in her cervical spine   Darletta Moll, PA-C 02/04/21 1555    Duffy Bruce, MD 02/04/21 1606
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
## 2021-02-05 DIAGNOSIS — M542 Cervicalgia: Secondary | ICD-10-CM

## 2021-02-05 DIAGNOSIS — G039 Meningitis, unspecified: Secondary | ICD-10-CM | POA: Diagnosis present

## 2021-02-05 DIAGNOSIS — D72829 Elevated white blood cell count, unspecified: Secondary | ICD-10-CM | POA: Diagnosis present

## 2021-02-05 DIAGNOSIS — Z720 Tobacco use: Secondary | ICD-10-CM | POA: Diagnosis present

## 2021-02-05 LAB — BASIC METABOLIC PANEL
Anion gap: 7 (ref 5–15)
BUN: 10 mg/dL (ref 6–20)
CO2: 23 mmol/L (ref 22–32)
Calcium: 8.9 mg/dL (ref 8.9–10.3)
Chloride: 106 mmol/L (ref 98–111)
Creatinine, Ser: 0.7 mg/dL (ref 0.44–1.00)
GFR, Estimated: >60 mL/min (ref >60)
Glucose, Bld: 170 mg/dL — ABNORMAL HIGH (ref 70–99)
Potassium: 4.1 mmol/L (ref 3.5–5.1)
Sodium: 136 mmol/L (ref 135–145)

## 2021-02-05 LAB — PATHOLOGIST SMEAR REVIEW

## 2021-02-05 LAB — CSF CELL COUNT WITH DIFFERENTIAL
Eosinophils, CSF: 5 %
Lymphs, CSF: 28 %
Monocyte-Macrophage-Spinal Fluid: 6 %
Other Cells, CSF: 1
RBC Count, CSF: 3071 /mm3 — ABNORMAL HIGH (ref 0–3)
RBC Count, CSF: 1906 /mm3 — ABNORMAL HIGH (ref 0–3)
Segmented Neutrophils-CSF: 60 %
Tube #: 3
Tube #: 4
WBC, CSF: 63 /mm3 (ref 0–5)
WBC, CSF: 0 /mm3 (ref 0–5)

## 2021-02-05 LAB — CBC
HCT: 38.4 % (ref 36.0–46.0)
Hemoglobin: 12.9 g/dL (ref 12.0–15.0)
MCH: 31.2 pg (ref 26.0–34.0)
MCHC: 33.6 g/dL (ref 30.0–36.0)
MCV: 92.8 fL (ref 80.0–100.0)
Platelets: 312 10*3/uL (ref 150–400)
RBC: 4.14 MIL/uL (ref 3.87–5.11)
RDW: 12 % (ref 11.5–15.5)
WBC: 8.9 10*3/uL (ref 4.0–10.5)
nRBC: 0 % (ref 0.0–0.2)

## 2021-02-05 LAB — RESP PANEL BY RT-PCR (FLU A&B, COVID) ARPGX2
Influenza A by PCR: NEGATIVE
Influenza B by PCR: NEGATIVE
SARS Coronavirus 2 by RT PCR: NEGATIVE

## 2021-02-05 LAB — CRYPTOCOCCAL ANTIGEN, CSF: Crypto Ag: NEGATIVE

## 2021-02-05 LAB — HIV ANTIBODY (ROUTINE TESTING W REFLEX): HIV Screen 4th Generation wRfx: NONREACTIVE

## 2021-02-05 MED ORDER — ALBUTEROL SULFATE (2.5 MG/3ML) 0.083% IN NEBU: 2.5000 mg | INHALATION_SOLUTION | Freq: Four times a day (QID) | RESPIRATORY_TRACT | Status: DC | PRN

## 2021-02-05 MED ORDER — LACTATED RINGERS IV SOLN: INTRAVENOUS | Status: DC

## 2021-02-05 MED ORDER — DEXTROSE 5 % IV SOLN
10.0000 mg/kg | Freq: Three times a day (TID) | INTRAVENOUS | Status: DC
  Administered 2021-02-05 (×2): 750 mg via INTRAVENOUS
  Filled 2021-02-05 (×4): qty 15

## 2021-02-05 MED ORDER — VANCOMYCIN HCL 750 MG/150ML IV SOLN
750.0000 mg | Freq: Three times a day (TID) | INTRAVENOUS | Status: DC
  Administered 2021-02-05: 10:00:00 750 mg via INTRAVENOUS
  Filled 2021-02-05 (×3): qty 150

## 2021-02-05 MED ORDER — SODIUM CHLORIDE 0.9 % IV SOLN
2.0000 g | Freq: Two times a day (BID) | INTRAVENOUS | Status: DC
  Administered 2021-02-05 – 2021-02-06 (×3): 2 g via INTRAVENOUS
  Filled 2021-02-05: qty 2
  Filled 2021-02-05 (×3): qty 20

## 2021-02-05 MED ORDER — CLONAZEPAM 0.5 MG PO TABS
0.5000 mg | ORAL_TABLET | Freq: Once | ORAL | Status: AC
  Administered 2021-02-05: 23:00:00 0.5 mg via ORAL
  Filled 2021-02-05: qty 1

## 2021-02-05 MED ORDER — NICOTINE 14 MG/24HR TD PT24: 14.0000 mg | MEDICATED_PATCH | Freq: Every day | TRANSDERMAL | Status: DC | PRN

## 2021-02-05 MED ORDER — VANCOMYCIN HCL 750 MG/150ML IV SOLN: 750.0000 mg | Freq: Three times a day (TID) | INTRAVENOUS | Status: DC

## 2021-02-05 MED ORDER — MORPHINE SULFATE (PF) 2 MG/ML IV SOLN
0.5000 mg | Freq: Once | INTRAVENOUS | Status: AC
  Administered 2021-02-05: 20:00:00 0.5 mg via INTRAVENOUS
  Filled 2021-02-05: qty 1

## 2021-02-05 MED ORDER — ALBUTEROL SULFATE HFA 108 (90 BASE) MCG/ACT IN AERS: 2.0000 | INHALATION_SPRAY | Freq: Four times a day (QID) | RESPIRATORY_TRACT | Status: DC | PRN

## 2021-02-05 NOTE — ED Notes (Signed)
Patient ambulatory to restroom without assistance. 

## 2021-02-05 NOTE — Progress Notes (Signed)
Patient c/o 8/10 pain in neck, sharp in nature. Only prn pain med available is tylenol. Pt medicated with this, but MD contacted for further pain medication orders.

## 2021-02-05 NOTE — ED Notes (Signed)
MD at bedside. 

## 2021-02-05 NOTE — Progress Notes (Signed)
Pharmacy Antibiotic Note  Vickie Gomez is a 35 y.o. female admitted on 02/04/2021 with Meningitis.  Pharmacy has been consulted for vancomycin and acyclovir dosing.  Plan: Vancomycin 1750 mg IV x 1 loading dose followed by 750 mg IV q8h.         Goal trough 15 - 20 mcg/ml  *Will order Vancomycin trough prior to 4th dose to determine if adjustment in dose in needed*  Continue Acyclovir 10 mg/kg (750 mg) IV q8h. LR @125  ml/hr Monitor renal function and adjust dose as clinically indicated  Height: 5\' 4"  (162.6 cm) Weight: 75 kg (165 lb 5.5 oz) IBW/kg (Calculated) : 54.7  Temp (24hrs), Avg:97.8 F (36.6 C), Min:97.5 F (36.4 C), Max:98 F (36.7 C)  Recent Labs  Lab 02/04/21 1657 02/05/21 0644  WBC 12.9* 8.9  CREATININE 0.77 0.70     Estimated Creatinine Clearance: 99.2 mL/min (by C-G formula based on SCr of 0.7 mg/dL).    Allergies  Allergen Reactions   Augmentin [Amoxicillin-Pot Clavulanate] Shortness Of Breath    Wheezing, stridor, anxiety   Ambien [Zolpidem Tartrate]     Chest Pain    Codeine Palpitations   Diphenhydramine Palpitations   Lorazepam Palpitations    Antimicrobials this admission: 12/19 meropenem x 1 12/19 vancomycin >>  12/19 acyclovir >>  12/19 ceftriaxone >>    Thank you for allowing pharmacy to be a part of this patients care.  Vickie Gomez PharmD, BCPS 02/05/2021 5:47 PM
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
## 2021-02-06 DIAGNOSIS — G9341 Metabolic encephalopathy: Secondary | ICD-10-CM

## 2021-02-06 DIAGNOSIS — R519 Headache, unspecified: Secondary | ICD-10-CM

## 2021-02-06 DIAGNOSIS — D72829 Elevated white blood cell count, unspecified: Secondary | ICD-10-CM

## 2021-02-06 LAB — CBC
HCT: 33.8 % — ABNORMAL LOW (ref 36.0–46.0)
Hemoglobin: 11.2 g/dL — ABNORMAL LOW (ref 12.0–15.0)
MCH: 30.5 pg (ref 26.0–34.0)
MCHC: 33.1 g/dL (ref 30.0–36.0)
MCV: 92.1 fL (ref 80.0–100.0)
Platelets: 283 10*3/uL (ref 150–400)
RBC: 3.67 MIL/uL — ABNORMAL LOW (ref 3.87–5.11)
RDW: 12 % (ref 11.5–15.5)
WBC: 12.5 10*3/uL — ABNORMAL HIGH (ref 4.0–10.5)
nRBC: 0 % (ref 0.0–0.2)

## 2021-02-06 LAB — BASIC METABOLIC PANEL
Anion gap: 5 (ref 5–15)
BUN: 8 mg/dL (ref 6–20)
CO2: 26 mmol/L (ref 22–32)
Calcium: 8.4 mg/dL — ABNORMAL LOW (ref 8.9–10.3)
Chloride: 108 mmol/L (ref 98–111)
Creatinine, Ser: 0.71 mg/dL (ref 0.44–1.00)
GFR, Estimated: >60 mL/min (ref >60)
Glucose, Bld: 106 mg/dL — ABNORMAL HIGH (ref 70–99)
Potassium: 3.5 mmol/L (ref 3.5–5.1)
Sodium: 139 mmol/L (ref 135–145)

## 2021-02-06 MED ORDER — LORATADINE 10 MG PO TABS: 10.0000 mg | ORAL_TABLET | Freq: Every day | ORAL | 0 refills | Status: AC

## 2021-02-06 MED ORDER — ACETAMINOPHEN 325 MG PO TABS: 650.0000 mg | ORAL_TABLET | Freq: Four times a day (QID) | ORAL | Status: AC | PRN

## 2021-02-06 MED ORDER — IBUPROFEN 600 MG PO TABS: 600.0000 mg | ORAL_TABLET | Freq: Four times a day (QID) | ORAL | 0 refills | Status: AC | PRN

## 2021-02-06 MED ORDER — IBUPROFEN 400 MG PO TABS: 600.0000 mg | ORAL_TABLET | Freq: Four times a day (QID) | ORAL | Status: DC | PRN

## 2021-02-06 MED ORDER — TRAMADOL HCL 50 MG PO TABS
50.0000 mg | ORAL_TABLET | Freq: Three times a day (TID) | ORAL | Status: DC | PRN
  Administered 2021-02-06: 07:00:00 50 mg via ORAL
  Filled 2021-02-06: qty 1

## 2021-02-06 NOTE — Progress Notes (Addendum)
Date of Admission:  02/04/2021     ID: Vickie Gomez is a 35 y.o. female Principal Problem:   Encephalopathy Active Problems:   Neck pain   Leukocytosis   Tobacco use   Meningitis    Subjective: Pt wants to go home Took a shower but felt dizzy when she bend down Also complains of ringing in her ears No rhinorrhea No fever since admission   Medications:   lidocaine  1 patch Transdermal Q24H    Objective: Vital signs in last 24 hours: Temp:  [97.7 F (36.5 C)-98.1 F (36.7 C)] 97.7 F (36.5 C) (12/21 0737) Pulse Rate:  [62-79] 62 (12/21 0737) Resp:  [16-18] 18 (12/21 0737) BP: (112-154)/(60-96) 154/96 (12/21 0737) SpO2:  [98 %-99 %] 98 % (12/21 0737)  PHYSICAL EXAM:  General: Alert, cooperative, no distress, appears stated age.  Head: Normocephalic, without obvious abnormality, atraumatic. Eyes: Conjunctivae clear, anicteric sclerae. Pupils are equal ENT Nares normal. No drainage or sinus tenderness. Lips, mucosa, and tongue normal. No Thrush Neck:symmetrical, no adenopathy, thyroid: non tender no carotid bruit and no JVD. Tenderness in c7 area  Back: tenderness on the lumbar area at the site of LP  Lungs: Clear to auscultation bilaterally. No Wheezing or Rhonchi. No rales. Heart: Regular rate and rhythm, no murmur, rub or gallop. Abdomen: Soft, non-tender,not distended. Bowel sounds normal. No masses Extremities: atraumatic, no cyanosis. No edema. No clubbing Skin: No rashes or lesions. Or bruising Lymph: Cervical, supraclavicular normal. Neurologic: Grossly non-focal  Lab Results Recent Labs    02/05/21 0644 02/06/21 0445  WBC 8.9 12.5*  HGB 12.9 11.2*  HCT 38.4 33.8*  NA 136 139  K 4.1 3.5  CL 106 108  CO2 23 26  BUN 10 8  CREATININE 0.70 0.71   Microbiology: 12/19 /22 BC - NG 02/04/21 CSF NG CSF cell count 1, 4 WBC 63 & 0 Rbc 3071 & 1906 Protein 51 Glucose 67  Studies/Results: CT HEAD WO CONTRAST (5MM)  Result Date:  02/04/2021 CLINICAL DATA:  Head trauma, moderate-severe; Neck trauma, impaired ROM (Age 35-64y). Hit head. Left-sided neck pain. EXAM: CT HEAD WITHOUT CONTRAST CT CERVICAL SPINE WITHOUT CONTRAST TECHNIQUE: Multidetector CT imaging of the head and cervical spine was performed following the standard protocol without intravenous contrast. Multiplanar CT image reconstructions of the cervical spine were also generated. COMPARISON:  None. FINDINGS: CT HEAD FINDINGS Brain: No evidence of large-territorial acute infarction. No parenchymal hemorrhage. No mass lesion. No extra-axial collection. No mass effect or midline shift. No hydrocephalus. Basilar cisterns are patent. Vascular: No hyperdense vessel. Skull: No acute fracture or focal lesion. Sinuses/Orbits: Paranasal sinuses and mastoid air cells are clear. The orbits are unremarkable. Other: None. CT CERVICAL SPINE FINDINGS Alignment: Normal. Skull base and vertebrae: No acute fracture. No aggressive appearing focal osseous lesion or focal pathologic process. Soft tissues and spinal canal: No prevertebral fluid or swelling. No visible canal hematoma. Upper chest: Unremarkable. Other: None. IMPRESSION: 1. No acute intracranial abnormality. 2. No acute displaced fracture or traumatic listhesis of the cervical spine. Electronically Signed   By: Iven Finn M.D.   On: 02/04/2021 16:16   CT Cervical Spine Wo Contrast  Result Date: 02/04/2021 CLINICAL DATA:  Head trauma, moderate-severe; Neck trauma, impaired ROM (Age 35-64y). Hit head. Left-sided neck pain. EXAM: CT HEAD WITHOUT CONTRAST CT CERVICAL SPINE WITHOUT CONTRAST TECHNIQUE: Multidetector CT imaging of the head and cervical spine was performed following the standard protocol without intravenous contrast. Multiplanar CT image reconstructions of the  cervical spine were also generated. COMPARISON:  None. FINDINGS: CT HEAD FINDINGS Brain: No evidence of large-territorial acute infarction. No parenchymal  hemorrhage. No mass lesion. No extra-axial collection. No mass effect or midline shift. No hydrocephalus. Basilar cisterns are patent. Vascular: No hyperdense vessel. Skull: No acute fracture or focal lesion. Sinuses/Orbits: Paranasal sinuses and mastoid air cells are clear. The orbits are unremarkable. Other: None. CT CERVICAL SPINE FINDINGS Alignment: Normal. Skull base and vertebrae: No acute fracture. No aggressive appearing focal osseous lesion or focal pathologic process. Soft tissues and spinal canal: No prevertebral fluid or swelling. No visible canal hematoma. Upper chest: Unremarkable. Other: None. IMPRESSION: 1. No acute intracranial abnormality. 2. No acute displaced fracture or traumatic listhesis of the cervical spine. Electronically Signed   By: Iven Finn M.D.   On: 02/04/2021 16:16   MR CERVICAL SPINE W WO CONTRAST  Result Date: 02/04/2021 CLINICAL DATA:  Initial evaluation for posterior neck pain, dizziness, headaches. Question epidural abscess. EXAM: MRI CERVICAL, THORACIC AND LUMBAR SPINE WITHOUT AND WITH CONTRAST TECHNIQUE: Multiplanar and multiecho pulse sequences of the cervical spine, to include the craniocervical junction and cervicothoracic junction, and thoracic and lumbar spine, were obtained without and with intravenous contrast. CONTRAST:  72mL GADAVIST GADOBUTROL 1 MMOL/ML IV SOLN COMPARISON:  Prior CT from earlier the same day. FINDINGS: MRI CERVICAL SPINE FINDINGS Alignment: Straightening of the normal cervical lordosis. No listhesis. Vertebrae: Vertebral body height maintained without acute or chronic fracture. Bone marrow signal intensity within normal limits. No discrete or worrisome osseous lesions. No evidence for osteomyelitis discitis or septic arthritis. Cord: Normal signal morphology. No abnormal enhancement. No epidural abscess or other collection. Posterior Fossa, vertebral arteries, paraspinal tissues: Unremarkable. Disc levels: C5-6: Mild disc bulge.  No  significant canal or foraminal stenosis. C6-7: Small central disc protrusion mildly indents the ventral thecal sac. No significant spinal stenosis or cord deformity. Foramina remain patent. MRI THORACIC SPINE FINDINGS Alignment: Physiologic with preservation of the normal thoracic kyphosis. No listhesis. Vertebrae: Vertebral body height maintained without acute or chronic fracture. Bone marrow signal intensity within normal limits. Few scattered benign hemangiomata noted. No worrisome osseous lesions. No evidence for osteomyelitis discitis or septic arthritis. Cord: Normal signal morphology. No abnormal enhancement. No epidural abscess or other collection. Paraspinal and other soft tissues: Unremarkable. Disc levels: No significant disc pathology within the thoracic spine. No stenosis or neural impingement. MRI LUMBAR SPINE FINDINGS Segmentation: Standard. Lowest well-formed disc space labeled the L5-S1 level. Alignment: Physiologic with preservation of the normal lumbar lordosis. No listhesis. Vertebrae: Vertebral body height maintained without acute or chronic fracture. Bone marrow signal intensity within normal limits. Benign hemangioma noted within the L4 vertebral body. No other discrete or worrisome osseous lesions. No evidence for osteomyelitis discitis or septic arthritis. Conus medullaris and cauda equina: Conus extends to the L1 level. Conus and cauda equina appear normal. No abnormal enhancement. No epidural abscess or other collection. Paraspinal and other soft tissues: Unremarkable. Disc levels: No significant disc pathology seen within the lumbar spine. No focal disc herniation. No significant stenosis or neural impingement. Mild lower lumbar facet hypertrophy. IMPRESSION: 1. No evidence for acute infection within the cervical, thoracic, or lumbar spine. No epidural abscess or other collection. 2. Mild degenerative disc disease at C5-6 and C6-7 without stenosis or neural impingement. Electronically  Signed   By: Jeannine Boga M.D.   On: 02/04/2021 19:57   MR THORACIC SPINE W WO CONTRAST  Result Date: 02/04/2021 CLINICAL DATA:  Initial evaluation for posterior  neck pain, dizziness, headaches. Question epidural abscess. EXAM: MRI CERVICAL, THORACIC AND LUMBAR SPINE WITHOUT AND WITH CONTRAST TECHNIQUE: Multiplanar and multiecho pulse sequences of the cervical spine, to include the craniocervical junction and cervicothoracic junction, and thoracic and lumbar spine, were obtained without and with intravenous contrast. CONTRAST:  58mL GADAVIST GADOBUTROL 1 MMOL/ML IV SOLN COMPARISON:  Prior CT from earlier the same day. FINDINGS: MRI CERVICAL SPINE FINDINGS Alignment: Straightening of the normal cervical lordosis. No listhesis. Vertebrae: Vertebral body height maintained without acute or chronic fracture. Bone marrow signal intensity within normal limits. No discrete or worrisome osseous lesions. No evidence for osteomyelitis discitis or septic arthritis. Cord: Normal signal morphology. No abnormal enhancement. No epidural abscess or other collection. Posterior Fossa, vertebral arteries, paraspinal tissues: Unremarkable. Disc levels: C5-6: Mild disc bulge.  No significant canal or foraminal stenosis. C6-7: Small central disc protrusion mildly indents the ventral thecal sac. No significant spinal stenosis or cord deformity. Foramina remain patent. MRI THORACIC SPINE FINDINGS Alignment: Physiologic with preservation of the normal thoracic kyphosis. No listhesis. Vertebrae: Vertebral body height maintained without acute or chronic fracture. Bone marrow signal intensity within normal limits. Few scattered benign hemangiomata noted. No worrisome osseous lesions. No evidence for osteomyelitis discitis or septic arthritis. Cord: Normal signal morphology. No abnormal enhancement. No epidural abscess or other collection. Paraspinal and other soft tissues: Unremarkable. Disc levels: No significant disc pathology  within the thoracic spine. No stenosis or neural impingement. MRI LUMBAR SPINE FINDINGS Segmentation: Standard. Lowest well-formed disc space labeled the L5-S1 level. Alignment: Physiologic with preservation of the normal lumbar lordosis. No listhesis. Vertebrae: Vertebral body height maintained without acute or chronic fracture. Bone marrow signal intensity within normal limits. Benign hemangioma noted within the L4 vertebral body. No other discrete or worrisome osseous lesions. No evidence for osteomyelitis discitis or septic arthritis. Conus medullaris and cauda equina: Conus extends to the L1 level. Conus and cauda equina appear normal. No abnormal enhancement. No epidural abscess or other collection. Paraspinal and other soft tissues: Unremarkable. Disc levels: No significant disc pathology seen within the lumbar spine. No focal disc herniation. No significant stenosis or neural impingement. Mild lower lumbar facet hypertrophy. IMPRESSION: 1. No evidence for acute infection within the cervical, thoracic, or lumbar spine. No epidural abscess or other collection. 2. Mild degenerative disc disease at C5-6 and C6-7 without stenosis or neural impingement. Electronically Signed   By: Jeannine Boga M.D.   On: 02/04/2021 19:57   MR Lumbar Spine W Wo Contrast  Result Date: 02/04/2021 CLINICAL DATA:  Initial evaluation for posterior neck pain, dizziness, headaches. Question epidural abscess. EXAM: MRI CERVICAL, THORACIC AND LUMBAR SPINE WITHOUT AND WITH CONTRAST TECHNIQUE: Multiplanar and multiecho pulse sequences of the cervical spine, to include the craniocervical junction and cervicothoracic junction, and thoracic and lumbar spine, were obtained without and with intravenous contrast. CONTRAST:  50mL GADAVIST GADOBUTROL 1 MMOL/ML IV SOLN COMPARISON:  Prior CT from earlier the same day. FINDINGS: MRI CERVICAL SPINE FINDINGS Alignment: Straightening of the normal cervical lordosis. No listhesis. Vertebrae:  Vertebral body height maintained without acute or chronic fracture. Bone marrow signal intensity within normal limits. No discrete or worrisome osseous lesions. No evidence for osteomyelitis discitis or septic arthritis. Cord: Normal signal morphology. No abnormal enhancement. No epidural abscess or other collection. Posterior Fossa, vertebral arteries, paraspinal tissues: Unremarkable. Disc levels: C5-6: Mild disc bulge.  No significant canal or foraminal stenosis. C6-7: Small central disc protrusion mildly indents the ventral thecal sac. No significant spinal stenosis or  cord deformity. Foramina remain patent. MRI THORACIC SPINE FINDINGS Alignment: Physiologic with preservation of the normal thoracic kyphosis. No listhesis. Vertebrae: Vertebral body height maintained without acute or chronic fracture. Bone marrow signal intensity within normal limits. Few scattered benign hemangiomata noted. No worrisome osseous lesions. No evidence for osteomyelitis discitis or septic arthritis. Cord: Normal signal morphology. No abnormal enhancement. No epidural abscess or other collection. Paraspinal and other soft tissues: Unremarkable. Disc levels: No significant disc pathology within the thoracic spine. No stenosis or neural impingement. MRI LUMBAR SPINE FINDINGS Segmentation: Standard. Lowest well-formed disc space labeled the L5-S1 level. Alignment: Physiologic with preservation of the normal lumbar lordosis. No listhesis. Vertebrae: Vertebral body height maintained without acute or chronic fracture. Bone marrow signal intensity within normal limits. Benign hemangioma noted within the L4 vertebral body. No other discrete or worrisome osseous lesions. No evidence for osteomyelitis discitis or septic arthritis. Conus medullaris and cauda equina: Conus extends to the L1 level. Conus and cauda equina appear normal. No abnormal enhancement. No epidural abscess or other collection. Paraspinal and other soft tissues:  Unremarkable. Disc levels: No significant disc pathology seen within the lumbar spine. No focal disc herniation. No significant stenosis or neural impingement. Mild lower lumbar facet hypertrophy. IMPRESSION: 1. No evidence for acute infection within the cervical, thoracic, or lumbar spine. No epidural abscess or other collection. 2. Mild degenerative disc disease at C5-6 and C6-7 without stenosis or neural impingement. Electronically Signed   By: Jeannine Boga M.D.   On: 02/04/2021 19:57   DG Chest Port 1 View  Result Date: 02/04/2021 CLINICAL DATA:  Initial evaluation for acute fever, weakness. EXAM: PORTABLE CHEST 1 VIEW COMPARISON:  Prior radiograph from 10/04/2020. FINDINGS: The cardiac and mediastinal silhouettes are stable in size and contour, and remain within normal limits. The lungs are normally inflated. No airspace consolidation, pleural effusion, or pulmonary edema. No pneumothorax. No acute osseous abnormality. IMPRESSION: No radiographic evidence for active cardiopulmonary disease. Electronically Signed   By: Jeannine Boga M.D.   On: 02/04/2021 22:58     Assessment/Plan: 35 year old female presenting with headache and neck pain. Has been treated for sinus infection in the past couple of months with  Augmentin and then doxycycline ( last oral antibiotic was 11/17-11/27) . None in the past 3 weeks History of trauma to the left side of the head by hitting on a table Lumbar puncture showed in 1 tube WBC of 63 with 60% neutrophils.  It was traumatic lumbar puncture with RBC of 3071 and the color of the CSF was pink.  Total protein was 51.The other tube had 0 wbc  Clinically patient does not look like she has meningitis.  There is no evidence of encephalitis. discontinued acyclovir and vancomycin.  t because of her complaint of watery fluid flowing from her nose which could be  an allergic reaction , I wanted to rule out csf leak and hence asked her to collect any fluid in a  sterile cup, but there was no rhinorrhea So  This unlikely is csf ;leak  CT head no also did not show any  pneumatocele. Because the 2nd tube cell count was zero ( I had asked them to do that because the initial report was just 1 tube) she has no meningitis or encephalitis- so will DC ceftriaxone Borderline leucocytes is from the decadron she got  CT head N , sinus clear  Tenderness c7 spine- MRI entire spine no fracture, discitis, abscess or other signs of infection  Discussed the management with patient and Dr.Wieting
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
## 2021-02-06 NOTE — Progress Notes (Signed)
Patient verbalized understanding of all discharge instructions including follow up

## 2021-02-06 NOTE — TOC Transition Note (Signed)
Transition of Care [facility]) - CM/SW Discharge Note   Patient Details  Name: Vickie Gomez MRN: [id number] Date of Birth: 04/27/1985  [facility] CM/SW Contact:  Candie Chroman, LCSW Phone Number: 02/06/2021, 2:28 PM   Clinical Narrative:  Patient has orders to discharge home today. CSW met with patient and introduced role. Patient confirmed she does not have a PCP since she doesn't have insurance. Gave PCP packet for free/low-cost healthcare in [facility] and intake paperwork for [facility]. Patient only discharging on Tylenol and Advil. No further concerns. CSW signing off.   Final next level of care: Home/Self Care Barriers to Discharge: No Barriers Identified   Patient Goals and CMS Choice        Discharge Placement                Patient to be transferred to facility by: Husband will take her home.   Patient and family notified of of transfer: 02/06/21  Discharge Plan and Services                                     Social Determinants of Health (SDOH) Interventions     Readmission Risk Interventions No flowsheet data found.

## 2021-02-07 LAB — HSV 1/2 PCR, CSF
HSV-1 DNA: NEGATIVE
HSV-2 DNA: NEGATIVE

## 2021-02-08 LAB — CSF CULTURE W GRAM STAIN: Culture: NO GROWTH

## 2021-02-09 LAB — CULTURE, BLOOD (ROUTINE X 2)
Culture: NO GROWTH
Culture: NO GROWTH

## 2021-06-03 ENCOUNTER — Ambulatory Visit: Admission: EM | Admit: 2021-06-03 | Discharge: 2021-06-03 | Disposition: A | Discharge status: Home / Self Care

## 2021-06-03 DIAGNOSIS — B80 Enterobiasis: Secondary | ICD-10-CM | POA: Diagnosis not present

## 2021-06-03 MED ORDER — ALBENDAZOLE 200 MG PO TABS: ORAL_TABLET | ORAL | 0 refills | Status: AC

## 2021-06-03 MED ORDER — ALBENDAZOLE 200 MG PO TABS: ORAL_TABLET | ORAL | 0 refills | Status: DC

## 2021-06-03 NOTE — ED Triage Notes (Addendum)
Patient is here for "possible pinworms". Possible exposure with anal itching. Stomach cramps, nausea. New dog at home. Loose stools "blood seen in stool".  ?
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
## 2021-06-03 NOTE — Discharge Instructions (Signed)
Give 1 dose of medication on the first day then give second dose 2 weeks later ? ?Medicine with food and it is okay for you to crush ? ? ?Wash your hands often with soap and water for at least 20 seconds. If soap and water are not available, use hand sanitizer. ?Keep your nails short, and do not bite your nails. ?Change clothing and underwear daily. ?Wash bedding, pajamas, underwear, and towels in hot water after each use until pinworms are gone. ?Do not scratch the skin around the anus. ?Take a shower instead of a bath until the infection is gone. ? ?For the diarrhea you may use Pepto and imodium  ?

## 2021-07-04 ENCOUNTER — Other Ambulatory Visit: Payer: Self-pay

## 2021-07-04 ENCOUNTER — Ambulatory Visit
Admission: EM | Admit: 2021-07-04 | Discharge: 2021-07-04 | Disposition: A | Discharge status: Home / Self Care | Attending: Emergency Medicine | Admitting: Emergency Medicine

## 2021-07-04 DIAGNOSIS — J069 Acute upper respiratory infection, unspecified: Secondary | ICD-10-CM | POA: Diagnosis not present

## 2021-07-04 DIAGNOSIS — J40 Bronchitis, not specified as acute or chronic: Secondary | ICD-10-CM | POA: Diagnosis not present

## 2021-07-04 MED ORDER — PREDNISONE 10 MG (21) PO TBPK: ORAL_TABLET | Freq: Every day | ORAL | 0 refills | Status: DC
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
## 2021-07-04 NOTE — ED Triage Notes (Signed)
Patient presents to Urgent Care with complaints of cough and chest congestion since a week. Taking cough meds no improvement.  Denies fever.

## 2021-07-04 NOTE — Discharge Instructions (Addendum)
the symptoms are more viral in nature. Use a humidifier at night Continue to take Zyrtec nightly The symptoms may continue for several weeks Can use an over-the-counter cough and cold medicine

## 2021-09-18 ENCOUNTER — Ambulatory Visit
Admission: EM | Admit: 2021-09-18 | Discharge: 2021-09-18 | Disposition: A | Discharge status: Home / Self Care | Attending: Emergency Medicine | Admitting: Emergency Medicine

## 2021-09-18 ENCOUNTER — Encounter: Payer: Self-pay | Admitting: Emergency Medicine

## 2021-09-18 DIAGNOSIS — J020 Streptococcal pharyngitis: Secondary | ICD-10-CM | POA: Insufficient documentation

## 2021-09-18 LAB — GROUP A STREP BY PCR: Group A Strep by PCR: DETECTED — AB

## 2021-09-18 MED ORDER — AZITHROMYCIN 250 MG PO TABS: 250.0000 mg | ORAL_TABLET | Freq: Every day | ORAL | 0 refills | Status: AC

## 2021-09-18 NOTE — ED Triage Notes (Signed)
Pt reports swollen tonsils since MOnday. Reports that feels hard to swallow. C/o headache.  Had sinus surgery 2 weeks ago.

## 2021-09-18 NOTE — Discharge Instructions (Addendum)
Take the Azithromycin daily for 5 days for treatment of your strep throat.  Gargle with warm salt water 2-3 times a day to soothe your throat, aid in pain relief, and aid in healing.  Take over-the-counter ibuprofen according to the package instructions as needed for pain.  You can also use Chloraseptic or Sucrets lozenges, 1 lozenge every 2 hours as needed for throat pain.  If you develop any new or worsening symptoms return for reevaluation.
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
## 2021-10-02 ENCOUNTER — Other Ambulatory Visit: Payer: Self-pay | Admitting: Physical Medicine & Rehabilitation

## 2021-10-02 DIAGNOSIS — M5412 Radiculopathy, cervical region: Secondary | ICD-10-CM

## 2021-10-15 ENCOUNTER — Ambulatory Visit
Admission: RE | Admit: 2021-10-15 | Discharge: 2021-10-15 | Disposition: A | Discharge status: Home / Self Care | Source: Ambulatory Visit | Attending: Physical Medicine & Rehabilitation | Admitting: Physical Medicine & Rehabilitation

## 2021-10-15 DIAGNOSIS — M5412 Radiculopathy, cervical region: Secondary | ICD-10-CM

## 2021-10-15 MED ORDER — TRIAMCINOLONE ACETONIDE 40 MG/ML IJ SUSP (RADIOLOGY)
60.0000 mg | Freq: Once | INTRAMUSCULAR | Status: AC
  Administered 2021-10-15: 60 mg via EPIDURAL

## 2021-10-15 MED ORDER — IOPAMIDOL (ISOVUE-M 300) INJECTION 61%
1.0000 mL | Freq: Once | INTRAMUSCULAR | Status: AC | PRN
  Administered 2021-10-15: 1 mL via EPIDURAL

## 2021-10-15 NOTE — Discharge Instructions (Signed)

## 2021-12-18 ENCOUNTER — Ambulatory Visit
Admission: EM | Admit: 2021-12-18 | Discharge: 2021-12-18 | Disposition: A | Discharge status: Home / Self Care | Attending: Physician Assistant | Admitting: Physician Assistant

## 2021-12-18 ENCOUNTER — Ambulatory Visit (INDEPENDENT_AMBULATORY_CARE_PROVIDER_SITE_OTHER)

## 2021-12-18 DIAGNOSIS — R059 Cough, unspecified: Secondary | ICD-10-CM

## 2021-12-18 DIAGNOSIS — J101 Influenza due to other identified influenza virus with other respiratory manifestations: Secondary | ICD-10-CM

## 2021-12-18 DIAGNOSIS — R079 Chest pain, unspecified: Secondary | ICD-10-CM | POA: Diagnosis not present

## 2021-12-18 DIAGNOSIS — R051 Acute cough: Secondary | ICD-10-CM | POA: Diagnosis present

## 2021-12-18 DIAGNOSIS — R509 Fever, unspecified: Secondary | ICD-10-CM | POA: Diagnosis not present

## 2021-12-18 DIAGNOSIS — J029 Acute pharyngitis, unspecified: Secondary | ICD-10-CM

## 2021-12-18 DIAGNOSIS — R112 Nausea with vomiting, unspecified: Secondary | ICD-10-CM | POA: Diagnosis present

## 2021-12-18 DIAGNOSIS — Z1152 Encounter for screening for COVID-19: Secondary | ICD-10-CM | POA: Insufficient documentation

## 2021-12-18 DIAGNOSIS — R062 Wheezing: Secondary | ICD-10-CM

## 2021-12-18 LAB — RESP PANEL BY RT-PCR (FLU A&B, COVID) ARPGX2
Influenza A by PCR: POSITIVE — AB
Influenza B by PCR: NEGATIVE
SARS Coronavirus 2 by RT PCR: NEGATIVE

## 2021-12-18 LAB — GROUP A STREP BY PCR: Group A Strep by PCR: NOT DETECTED

## 2021-12-18 MED ORDER — ONDANSETRON 4 MG PO TBDP: 4.0000 mg | ORAL_TABLET | Freq: Three times a day (TID) | ORAL | 0 refills | Status: AC | PRN

## 2021-12-18 MED ORDER — OSELTAMIVIR PHOSPHATE 75 MG PO CAPS: 75.0000 mg | ORAL_CAPSULE | Freq: Two times a day (BID) | ORAL | 0 refills | Status: AC

## 2021-12-18 MED ORDER — ONDANSETRON 8 MG PO TBDP
8.0000 mg | ORAL_TABLET | Freq: Once | ORAL | Status: AC
  Administered 2021-12-18: 8 mg via ORAL
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
## 2021-12-18 NOTE — Discharge Instructions (Addendum)
-  You are negative for COVID and strep.  Your chest x-ray does not show pneumonia but you do have the flu. - I sent Tamiflu.  I also sent nausea medication.  You may take over-the-counter cough medication and plenty of rest and fluids. - You need to be seen again if you have any uncontrolled fever, weakness or breathing difficulty.

## 2021-12-18 NOTE — ED Triage Notes (Signed)
Patient reports cough, burning in chest, wheezing, body aches -- started this AM.   Patient reports she has been coughing so much it makes her vomit.

## 2021-12-20 ENCOUNTER — Telehealth: Admitting: Physician Assistant

## 2021-12-20 NOTE — Progress Notes (Signed)
The patient no-showed for appointment despite this provider sending direct link x 2 with no response and waiting for at least 10 minutes from appointment time for patient to join. They will be marked as a NS for this appointment/time.   However, of note, it appears patient was needing appt for family members. She had been advised it this were the case she should schedule appts for them through their own charts, not hers. Most likely this was scheduled in error and will be no charged.  Mar Daring, PA-C

## 2022-01-07 ENCOUNTER — Ambulatory Visit
Admission: EM | Admit: 2022-01-07 | Discharge: 2022-01-07 | Disposition: A | Discharge status: Home / Self Care | Attending: Internal Medicine | Admitting: Internal Medicine

## 2022-01-07 DIAGNOSIS — R200 Anesthesia of skin: Secondary | ICD-10-CM | POA: Diagnosis not present

## 2022-01-07 MED ORDER — LIDOCAINE VISCOUS HCL 2 % MT SOLN: 15.0000 mL | Freq: Four times a day (QID) | OROMUCOSAL | 0 refills | Status: DC | PRN

## 2022-01-07 MED ORDER — LIDOCAINE VISCOUS HCL 2 % MT SOLN: 15.0000 mL | Freq: Four times a day (QID) | OROMUCOSAL | 0 refills | Status: AC | PRN

## 2022-01-07 NOTE — ED Triage Notes (Signed)
Pt. Presents to UC c/o tongue and throat swelling along with numbness since yesterday. Pt. Also states she had food poisoning Sunday and these symptoms progressed afterwards.

## 2022-01-07 NOTE — Discharge Instructions (Addendum)
Please swish and swallow mouthwash as directed Maintain adequate hydration If you have worsening discomfort, shortness of breath or tightness in your throat-please return to urgent care or go to ED to be reevaluated.
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
## 2022-07-02 ENCOUNTER — Other Ambulatory Visit: Payer: Self-pay | Admitting: Family Medicine

## 2022-07-02 ENCOUNTER — Ambulatory Visit
Admission: RE | Admit: 2022-07-02 | Discharge: 2022-07-02 | Disposition: A | Discharge status: Home / Self Care | Source: Ambulatory Visit | Attending: Family Medicine | Admitting: Family Medicine

## 2022-07-02 DIAGNOSIS — R1012 Left upper quadrant pain: Secondary | ICD-10-CM

## 2022-07-02 MED ORDER — IOPAMIDOL (ISOVUE-300) INJECTION 61%
100.0000 mL | Freq: Once | INTRAVENOUS | Status: AC | PRN
  Administered 2022-07-02: 100 mL via INTRAVENOUS

## 2024-03-10 ENCOUNTER — Other Ambulatory Visit: Payer: Self-pay | Admitting: Family Medicine

## 2024-03-10 ENCOUNTER — Ambulatory Visit
Admission: RE | Admit: 2024-03-10 | Discharge: 2024-03-10 | Disposition: A | Discharge status: Home / Self Care | Source: Ambulatory Visit | Attending: Family Medicine | Admitting: Family Medicine

## 2024-03-10 DIAGNOSIS — R102 Pelvic and perineal pain unspecified side: Secondary | ICD-10-CM

## 2024-03-10 DIAGNOSIS — R1032 Left lower quadrant pain: Secondary | ICD-10-CM

## 2024-03-10 MED ORDER — IOHEXOL 300 MG/ML  SOLN
100.0000 mL | Freq: Once | INTRAMUSCULAR | Status: AC | PRN
  Administered 2024-03-10: 100 mL via INTRAVENOUS

## 2024-03-10 NOTE — Progress Notes (Signed)
 Chief Complaint  Patient presents with   Flank Pain    Patient was seen in UC on Tuesday Dx with UTI and BV Patient is on macrobid and was given a gel (has not started yet)  Patient having flank pain and pelvic pain  Pain radiates bilaterally  Denies dysuria     Patient is agreeable to [employer] scribe.   History of Present Illness Vickie Gomez is a 39 year old female who presents with kidney pain and symptoms suggestive of a urinary tract infection (UTI).  She experiences kidney pain located in her lower back on both sides, described as coming and going in waves. There is a sensation of pressure in her lower abdomen and groin area. No burning sensation during urination, blood in urine, fevers, or chills. She feels generally 'off' and 'yucky'.  She was seen at an urgent care on Tuesday, where her urine was noted to be suspicious, but no culture was performed. She was prescribed Macrobid for a UTI. Additionally, she was diagnosed with bacterial vaginosis from a swab test and was prescribed a gel treatment, which she has not yet started. She does not have typical symptoms of bacterial vaginosis such as discharge.  She denies any chance of pregnancy, noting her husband has had a vasectomy and her last menstrual cycle was on the first of the month. She initially thought her symptoms were due to a yeast infection following her period and used Monistat, which seemed to worsen her symptoms, prompting her visit to urgent care. No changes in bowel habits or blood in her stool.    ROS  Review of systems is unremarkable for any active cardiac, respiratory, GI, GU, hematologic, neurologic, dermatologic, HEENT, or psychiatric symptoms except as noted above.  No fevers, chills, or constitutional symptoms.   Current Outpatient Medications  Medication Sig Dispense Refill   metroNIDAZOLE  (METROGEL ) 0.75 % (37.5mg /5 gram) vaginal gel Place 1 applicator vaginally at bedtime for 5 days 70 g 0    nitrofurantoin, macrocrystal-monohydrate, (MACROBID) 100 MG capsule Take 1 capsule (100 mg total) by mouth 2 (two) times daily for 7 days 14 capsule 0   FLUoxetine (PROZAC) 10 MG capsule Take 10 mg by mouth once daily (Patient not taking: Reported on 03/10/2024)     No current facility-administered medications for this visit.    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  Exam BP 132/80   Pulse 81   Ht 157.5 cm (5' 2)   Wt 72.9 kg (160 lb 12.8 oz)   LMP  02/18/2024 (Exact Date)   SpO2 99%   BMI 29.41 kg/m   General. Well appearing; NAD; VS reviewed     Eyes. Sclera and conjunctiva clear Oropharynx. No suspicious lesions Neck. Supple. No swelling, masses, thyroid normal size, no masses palpated.   Lungs. Respirations unlabored; clear to auscultation bilaterally Cardiovascular. Heart regular rate and rhythm without murmurs, gallops, or rubs GI: Abdomen soft, nondistended, mildly tender in the suprapubic region without rebound tenderness or guarding.  No organomegaly or masses noted.   Bowel sounds are active.  Negative for CVA tenderness bilaterally. Extremities: No edema Skin. Normal color and turgor  Assessment & Plan  Pelvic and lower abdominal pain Intermittent pain and pressure, possibly due to UTI or kidney stones.  Repeat labs today.  If labs are stable or have concern for kidney stone we may need to consider imaging giving symptoms ongoing for the past week.  Urinary tract infection Suspected UTI with abdominal and groin pain. Differential includes kidney stones, worsening UTI, or pyelonephritis.  Repeat labs.  Possible imaging depending on labs.  Low back pain Intermittent pain possibly related to UTI or kidney stones. Differential includes kidney stones. - Ordered repeat urinalysis and urine culture. - Ordered comprehensive metabolic panel (CMP). - Ordered complete blood count (CBC).  Bacterial vaginosis Diagnosed with BV. Symptoms atypical. Metronidazole  gel prescribed. - Continue metronidazole  gel treatment as prescribed.    CBC does show elevated white count at 12.7.  UA negative for infection and CMP and pregnancy test are negative.  Given the diffuse lower abdominal pain need to rule out appendicitis or nephrolithiasis.  CT abdomen pelvis with contrast ordered.  F/U: Patient to follow-up as needed.  JASON HESTLE WHITAKER, PA  This note has been created using automated tools and reviewed for accuracy by JASON HESTLE WHITAKER.   Note: This dictation was prepared with Dragon dictation along with smaller phrase technology. Any transcriptional errors that result from this process are unintentional.
